# Patient Record
Sex: Female | Born: 1935 | Race: White | Hispanic: No | Smoking: Never smoker
Health system: Southern US, Community
[De-identification: ages and names within clinical notes are randomized; demographics above are authoritative.]

## PROBLEM LIST (undated history)

## (undated) DIAGNOSIS — G459 Transient cerebral ischemic attack, unspecified: Secondary | ICD-10-CM

## (undated) DIAGNOSIS — K219 Gastro-esophageal reflux disease without esophagitis: Secondary | ICD-10-CM

## (undated) DIAGNOSIS — R42 Dizziness and giddiness: Secondary | ICD-10-CM

## (undated) DIAGNOSIS — I10 Essential (primary) hypertension: Secondary | ICD-10-CM

## (undated) DIAGNOSIS — G2581 Restless legs syndrome: Secondary | ICD-10-CM

## (undated) DIAGNOSIS — Z974 Presence of external hearing-aid: Secondary | ICD-10-CM

## (undated) DIAGNOSIS — C44329 Squamous cell carcinoma of skin of other parts of face: Secondary | ICD-10-CM

## (undated) DIAGNOSIS — H3530 Unspecified macular degeneration: Secondary | ICD-10-CM

## (undated) HISTORY — DX: Unspecified macular degeneration: H35.30

## (undated) HISTORY — PX: OTHER SURGICAL HISTORY: SHX169

## (undated) HISTORY — DX: Squamous cell carcinoma of skin of other parts of face: C44.329

## (undated) HISTORY — PX: JOINT REPLACEMENT: SHX530

## (undated) HISTORY — PX: EYE SURGERY: SHX253

## (undated) HISTORY — PX: CHOLECYSTECTOMY: SHX55

## (undated) HISTORY — PX: WISDOM TOOTH EXTRACTION: SHX21

## (undated) HISTORY — DX: Transient cerebral ischemic attack, unspecified: G45.9

---

## 2013-10-30 ENCOUNTER — Inpatient Hospital Stay: Payer: Self-pay | Admitting: Internal Medicine

## 2013-10-30 LAB — CBC
HCT: 37 % (ref 35.0–47.0)
HGB: 12.4 g/dL (ref 12.0–16.0)
MCH: 31.2 pg (ref 26.0–34.0)
MCHC: 33.5 g/dL (ref 32.0–36.0)
MCV: 93 fL (ref 80–100)
PLATELETS: 284 10*3/uL (ref 150–440)
RBC: 3.97 10*6/uL (ref 3.80–5.20)
RDW: 13 % (ref 11.5–14.5)
WBC: 21.2 10*3/uL — AB (ref 3.6–11.0)

## 2013-10-30 LAB — URINALYSIS, COMPLETE
BILIRUBIN, UR: NEGATIVE
Blood: NEGATIVE
Glucose,UR: NEGATIVE mg/dL (ref 0–75)
Ketone: NEGATIVE
Leukocyte Esterase: NEGATIVE
Nitrite: NEGATIVE
Ph: 5 (ref 4.5–8.0)
Protein: 30
RBC,UR: NONE SEEN /HPF (ref 0–5)
Specific Gravity: 1.021 (ref 1.003–1.030)
Squamous Epithelial: 1
WBC UR: 1 /HPF (ref 0–5)

## 2013-10-30 LAB — BASIC METABOLIC PANEL
ANION GAP: 5 — AB (ref 7–16)
BUN: 35 mg/dL — ABNORMAL HIGH (ref 7–18)
CALCIUM: 9.4 mg/dL (ref 8.5–10.1)
Chloride: 97 mmol/L — ABNORMAL LOW (ref 98–107)
Co2: 25 mmol/L (ref 21–32)
Creatinine: 1.58 mg/dL — ABNORMAL HIGH (ref 0.60–1.30)
EGFR (African American): 34 — ABNORMAL LOW
GFR CALC NON AF AMER: 30 — AB
GLUCOSE: 155 mg/dL — AB (ref 65–99)
OSMOLALITY: 266 (ref 275–301)
POTASSIUM: 3.3 mmol/L — AB (ref 3.5–5.1)
SODIUM: 127 mmol/L — AB (ref 136–145)

## 2013-10-30 LAB — TROPONIN I

## 2013-10-30 LAB — PHOSPHORUS: Phosphorus: 2.1 mg/dL — ABNORMAL LOW (ref 2.5–4.9)

## 2013-10-30 LAB — MAGNESIUM: MAGNESIUM: 2 mg/dL

## 2013-10-30 LAB — PROTIME-INR
INR: 1.1
PROTHROMBIN TIME: 14.5 s (ref 11.5–14.7)

## 2013-10-30 LAB — RAPID INFLUENZA A&B ANTIGENS

## 2013-10-31 LAB — CBC WITH DIFFERENTIAL/PLATELET
Basophil #: 0.1 10*3/uL (ref 0.0–0.1)
Basophil %: 0.3 %
Eosinophil #: 0 10*3/uL (ref 0.0–0.7)
Eosinophil %: 0.1 %
HCT: 29.2 % — ABNORMAL LOW (ref 35.0–47.0)
HGB: 10 g/dL — ABNORMAL LOW (ref 12.0–16.0)
Lymphocyte #: 1.6 10*3/uL (ref 1.0–3.6)
Lymphocyte %: 6.7 %
MCH: 31.9 pg (ref 26.0–34.0)
MCHC: 34.3 g/dL (ref 32.0–36.0)
MCV: 93 fL (ref 80–100)
Monocyte #: 2.5 x10 3/mm — ABNORMAL HIGH (ref 0.2–0.9)
Monocyte %: 10.4 %
NEUTROS ABS: 19.5 10*3/uL — AB (ref 1.4–6.5)
Neutrophil %: 82.5 %
Platelet: 238 10*3/uL (ref 150–440)
RBC: 3.14 10*6/uL — ABNORMAL LOW (ref 3.80–5.20)
RDW: 13 % (ref 11.5–14.5)
WBC: 23.6 10*3/uL — ABNORMAL HIGH (ref 3.6–11.0)

## 2013-10-31 LAB — BASIC METABOLIC PANEL
Anion Gap: 10 (ref 7–16)
BUN: 35 mg/dL — ABNORMAL HIGH (ref 7–18)
CALCIUM: 8.5 mg/dL (ref 8.5–10.1)
CHLORIDE: 100 mmol/L (ref 98–107)
CO2: 23 mmol/L (ref 21–32)
Creatinine: 1.47 mg/dL — ABNORMAL HIGH (ref 0.60–1.30)
EGFR (African American): 38 — ABNORMAL LOW
GFR CALC NON AF AMER: 32 — AB
GLUCOSE: 126 mg/dL — AB (ref 65–99)
OSMOLALITY: 276 (ref 275–301)
Potassium: 3.5 mmol/L (ref 3.5–5.1)
Sodium: 133 mmol/L — ABNORMAL LOW (ref 136–145)

## 2013-11-01 LAB — CBC WITH DIFFERENTIAL/PLATELET
Bands: 2 %
HCT: 30 % — ABNORMAL LOW (ref 35.0–47.0)
HGB: 10 g/dL — AB (ref 12.0–16.0)
LYMPHS PCT: 7 %
MCH: 31.1 pg (ref 26.0–34.0)
MCHC: 33.4 g/dL (ref 32.0–36.0)
MCV: 93 fL (ref 80–100)
MONOS PCT: 16 %
Metamyelocyte: 1 %
Myelocyte: 1 %
PLATELETS: 259 10*3/uL (ref 150–440)
RBC: 3.22 10*6/uL — AB (ref 3.80–5.20)
RDW: 12.9 % (ref 11.5–14.5)
SEGMENTED NEUTROPHILS: 73 %
WBC: 28.6 10*3/uL — AB (ref 3.6–11.0)

## 2013-11-01 LAB — BASIC METABOLIC PANEL
Anion Gap: 7 (ref 7–16)
BUN: 31 mg/dL — ABNORMAL HIGH (ref 7–18)
CREATININE: 1.32 mg/dL — AB (ref 0.60–1.30)
Calcium, Total: 8.5 mg/dL (ref 8.5–10.1)
Chloride: 101 mmol/L (ref 98–107)
Co2: 25 mmol/L (ref 21–32)
GFR CALC AF AMER: 43 — AB
GFR CALC NON AF AMER: 37 — AB
GLUCOSE: 119 mg/dL — AB (ref 65–99)
Osmolality: 274 (ref 275–301)
Potassium: 3.8 mmol/L (ref 3.5–5.1)
Sodium: 133 mmol/L — ABNORMAL LOW (ref 136–145)

## 2013-11-01 LAB — WBC: WBC: 28.4 10*3/uL — AB (ref 3.6–11.0)

## 2013-11-01 LAB — URINE CULTURE

## 2013-11-02 LAB — CBC WITH DIFFERENTIAL/PLATELET
BANDS NEUTROPHIL: 3 %
COMMENT - H1-COM4: NORMAL
HCT: 29 % — AB (ref 35.0–47.0)
HGB: 9.7 g/dL — ABNORMAL LOW (ref 12.0–16.0)
LYMPHS PCT: 4 %
MCH: 31.2 pg (ref 26.0–34.0)
MCHC: 33.3 g/dL (ref 32.0–36.0)
MCV: 94 fL (ref 80–100)
MONOS PCT: 6 %
Metamyelocyte: 1 %
Myelocyte: 2 %
NRBC/100 WBC: 1 /
PLATELETS: 282 10*3/uL (ref 150–440)
RBC: 3.1 10*6/uL — AB (ref 3.80–5.20)
RDW: 13.1 % (ref 11.5–14.5)
SEGMENTED NEUTROPHILS: 84 %
WBC: 29.8 10*3/uL — ABNORMAL HIGH (ref 3.6–11.0)

## 2013-11-03 LAB — CBC WITH DIFFERENTIAL/PLATELET
Bands: 3 %
EOS PCT: 1 %
HCT: 31.3 % — ABNORMAL LOW (ref 35.0–47.0)
HGB: 10.4 g/dL — ABNORMAL LOW (ref 12.0–16.0)
Lymphocytes: 12 %
MCH: 31.2 pg (ref 26.0–34.0)
MCHC: 33.2 g/dL (ref 32.0–36.0)
MCV: 94 fL (ref 80–100)
MONOS PCT: 4 %
Metamyelocyte: 2 %
Myelocyte: 1 %
PLATELETS: 309 10*3/uL (ref 150–440)
RBC: 3.34 10*6/uL — ABNORMAL LOW (ref 3.80–5.20)
RDW: 13.1 % (ref 11.5–14.5)
Segmented Neutrophils: 77 %
WBC: 27.2 10*3/uL — ABNORMAL HIGH (ref 3.6–11.0)

## 2013-11-04 LAB — CBC WITH DIFFERENTIAL/PLATELET
BANDS NEUTROPHIL: 7 %
COMMENT - H1-COM2: NORMAL
Eosinophil: 2 %
HCT: 28.9 % — ABNORMAL LOW (ref 35.0–47.0)
HGB: 9.7 g/dL — ABNORMAL LOW (ref 12.0–16.0)
LYMPHS PCT: 8 %
MCH: 31.2 pg (ref 26.0–34.0)
MCHC: 33.4 g/dL (ref 32.0–36.0)
MCV: 93 fL (ref 80–100)
METAMYELOCYTE: 2 %
MYELOCYTE: 2 %
Monocytes: 9 %
PLATELETS: 291 10*3/uL (ref 150–440)
RBC: 3.11 10*6/uL — AB (ref 3.80–5.20)
RDW: 13.2 % (ref 11.5–14.5)
Segmented Neutrophils: 70 %
WBC: 21.6 10*3/uL — ABNORMAL HIGH (ref 3.6–11.0)

## 2013-11-04 LAB — CULTURE, BLOOD (SINGLE)

## 2013-11-05 LAB — EXPECTORATED SPUTUM ASSESSMENT W REFEX TO RESP CULTURE

## 2015-01-20 IMAGING — CR DG CHEST 2V
1 series · 2 of 2 positions shown · non-contrast
Comparison: None

CLINICAL DATA: One week history of fever and productive cough.

EXAM:
CHEST  2 VIEW

[Series 1: pa · 0.17mm/px · 2 of 2 slices shown]
[im 1/2]
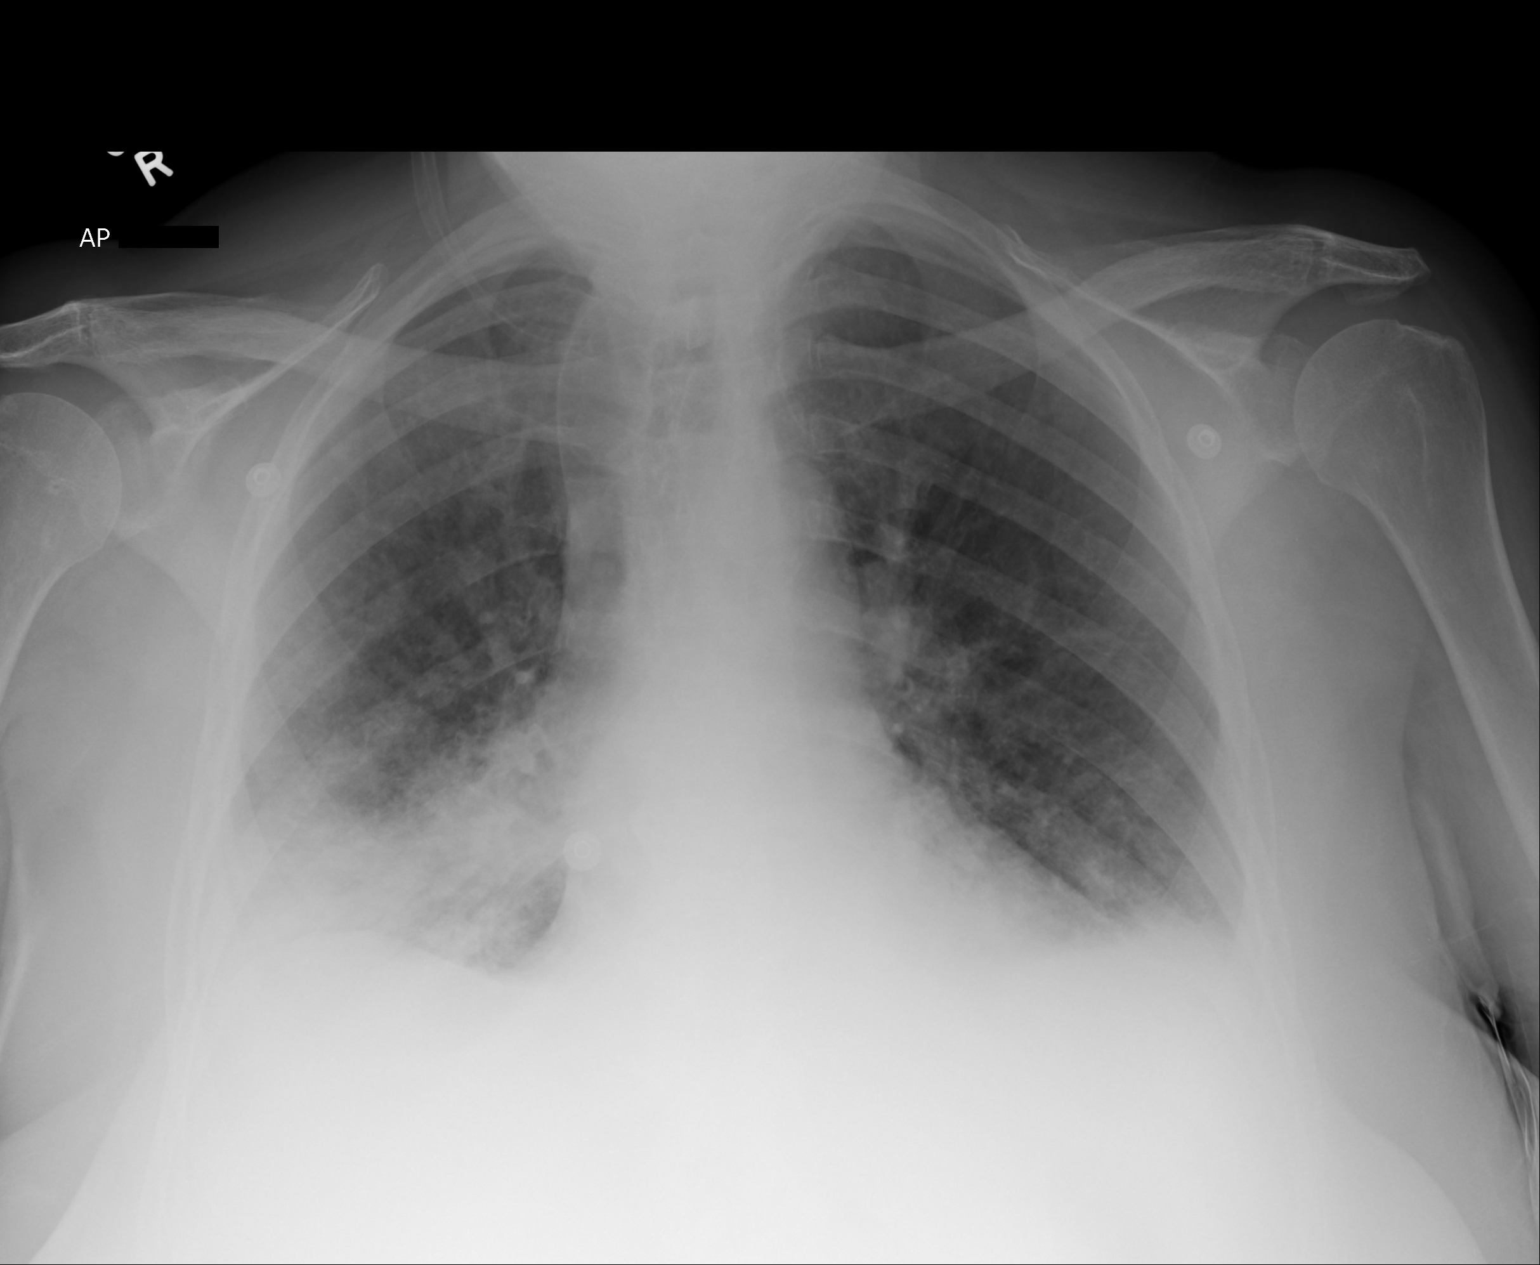
[im 2/2]
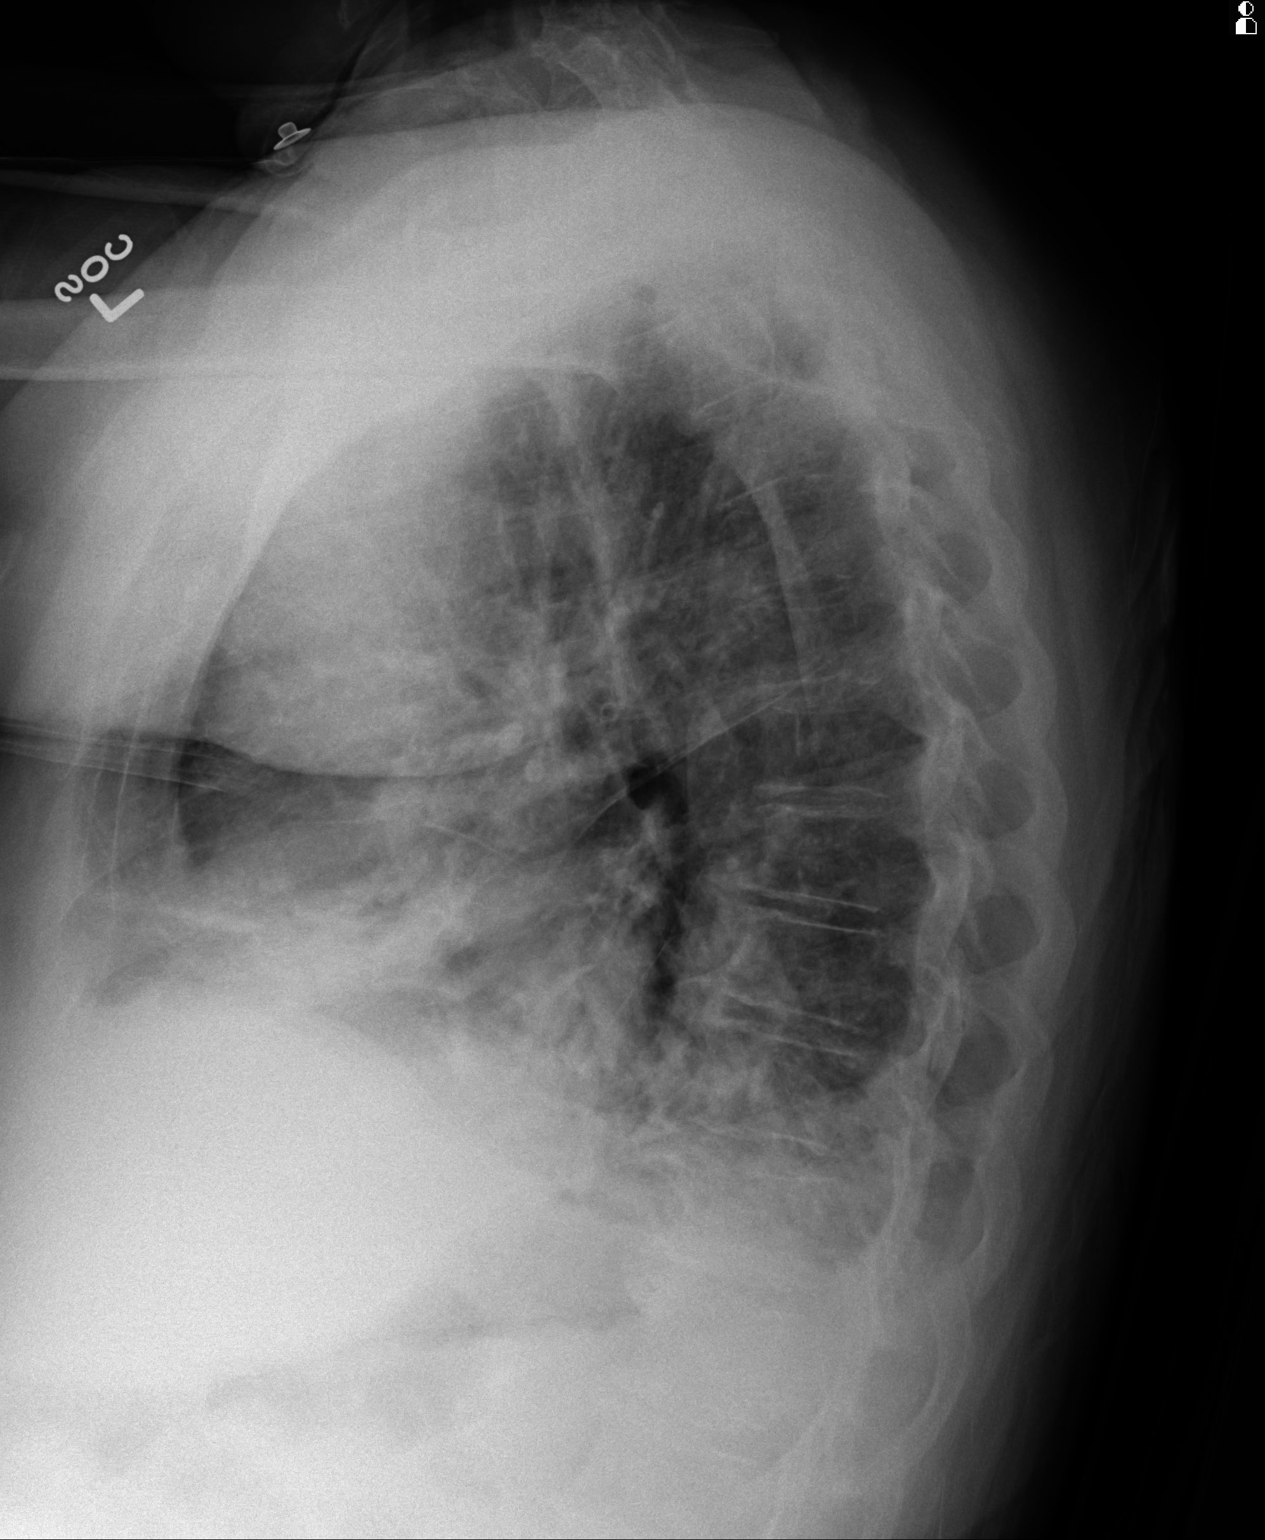

[2 of 2 positions shown; findings below may reference images not displayed]

FINDINGS: The lungs are mildly hypoinflated. There are bibasilar infiltrates
greater on the right than on the left. There is partial obscuration
of the hemidiaphragms in part secondary to small bilateral pleural
effusions. . The cardiac silhouette is normal in size. The pulmonary
vascularity is mildly prominent centrally. There is an azygos lobe
type anatomy.
IMPRESSION: The findings are consistent with bibasilar pneumonia greater on the
right than on the left. There small bilateral pleural effusions.
There is no evidence of pulmonary edema.

## 2015-01-23 IMAGING — CR DG CHEST 2V
1 series · 2 of 2 positions shown · non-contrast
Comparison: PA and lateral chest x-ray dated October 30, 2013.

CLINICAL DATA: Pneumonia

EXAM:
CHEST  2 VIEW

[Series 1: pa · 0.17mm/px · 2 of 2 slices shown]
[im 1/2]
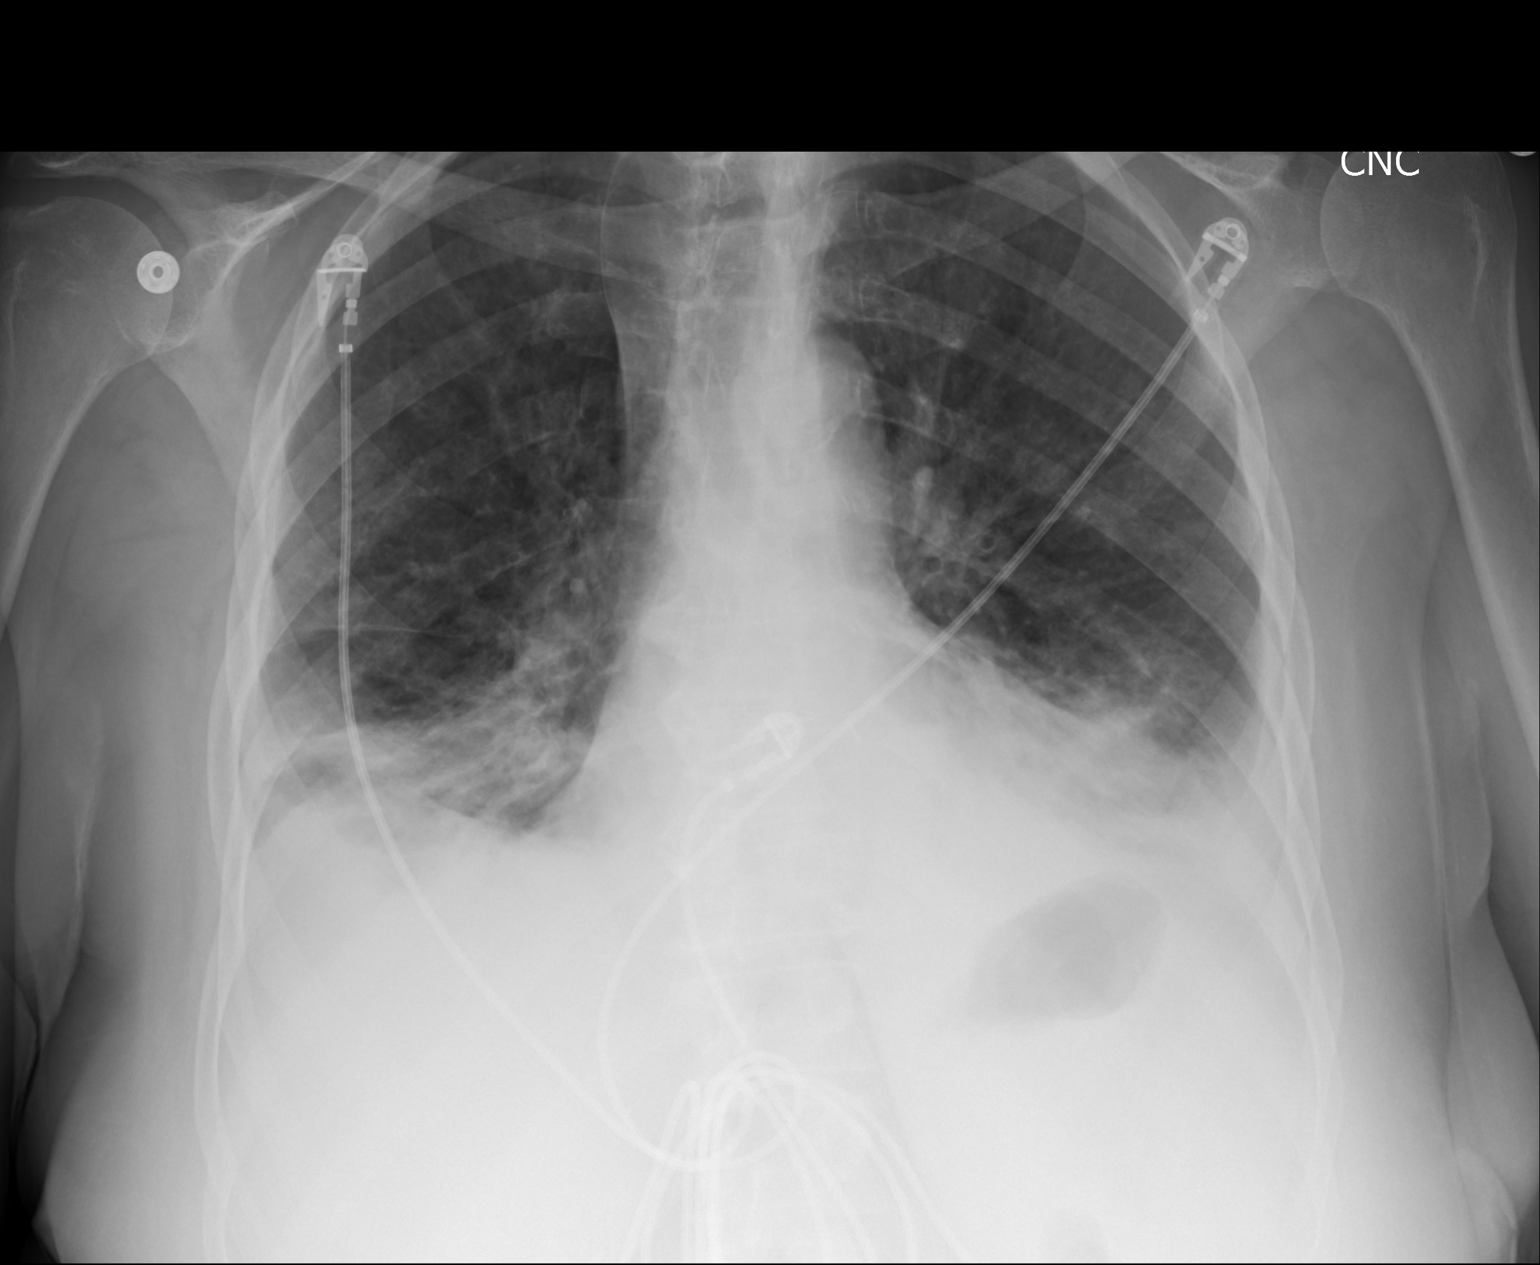
[im 2/2]
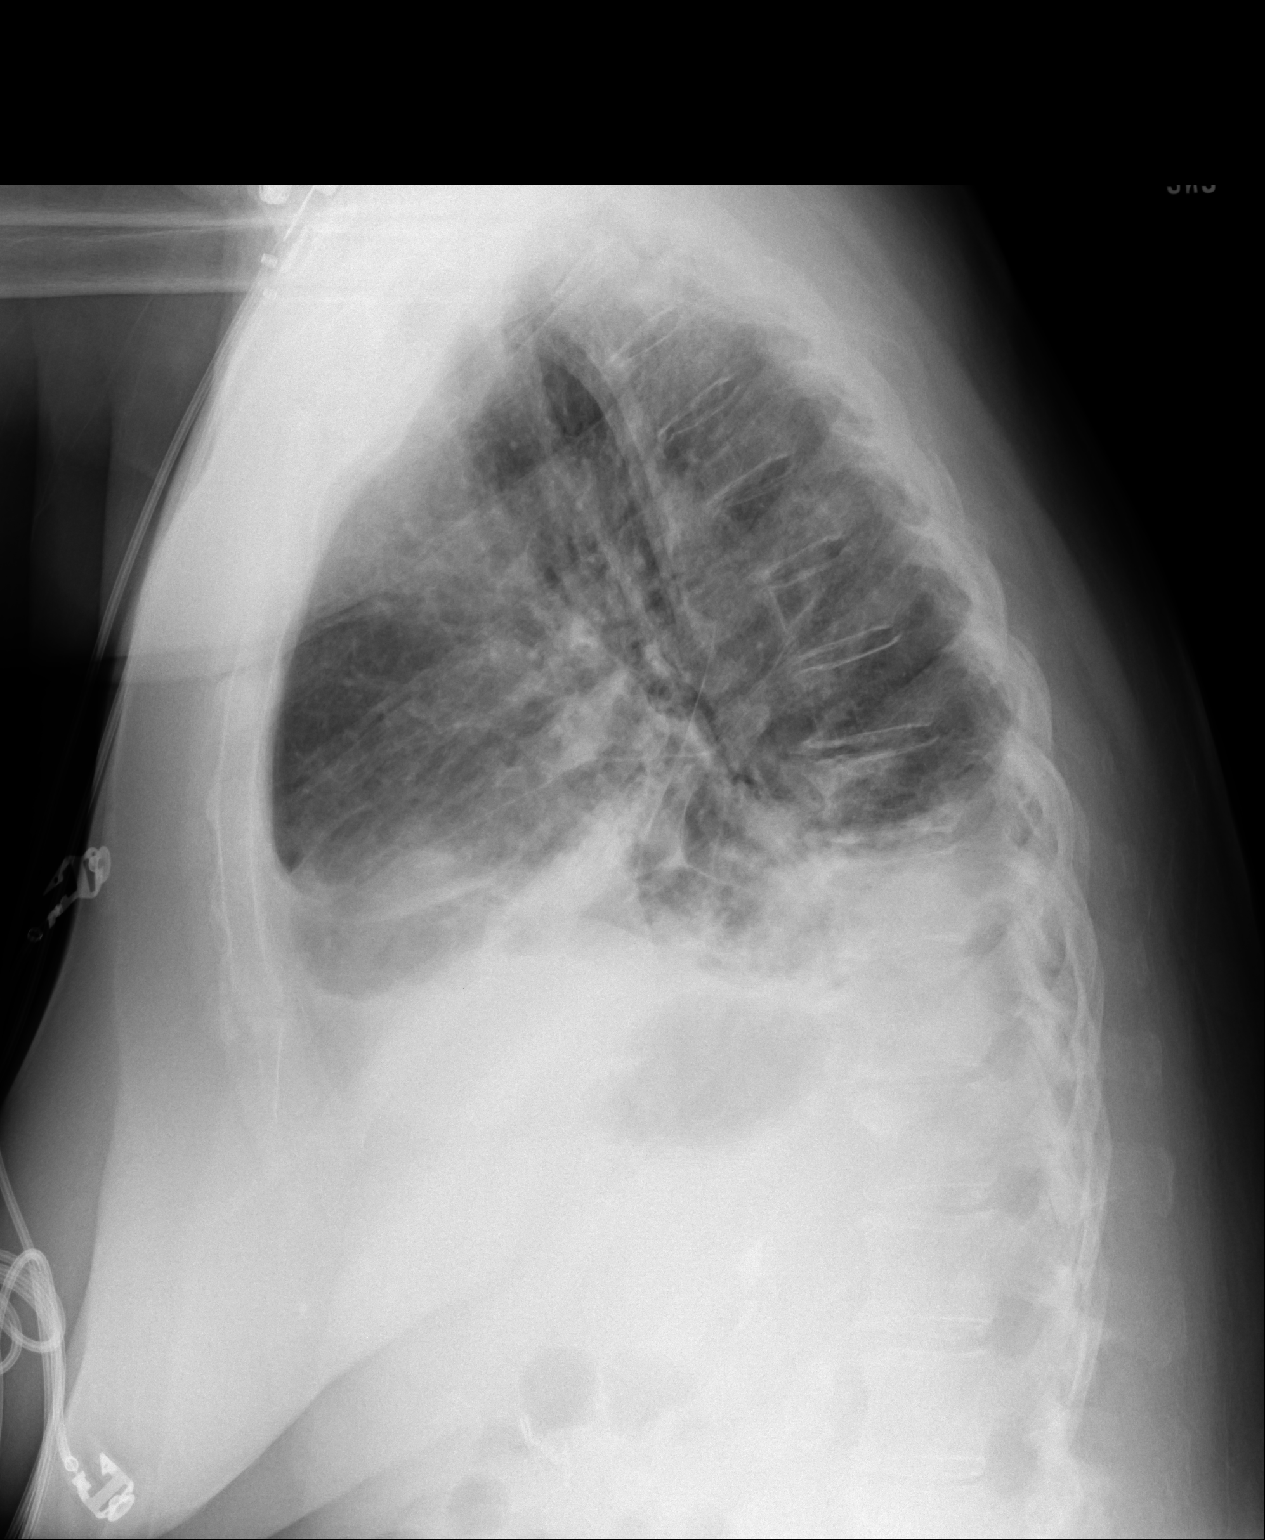

[2 of 2 positions shown; findings below may reference images not displayed]

FINDINGS: The lungs remain mildly hypoinflated. There is an azygos lobe
anatomy on the right. Bibasilar infiltrates are present and better
defined today. The pulmonary interstitial markings are less
prominent. The pulmonary vascularity does not appear engorged. The
cardiac silhouette is not enlarged. There are small bilateral
pleural effusions which have become more conspicuous since the
previous study. The mediastinum is normal in width. The bony thorax
exhibits no acute abnormality.
IMPRESSION: There are persistent bibasilar densities consistent with pneumonia.
There is slightly more pleural fluid visible today. Continued
follow-up films are recommended.

## 2015-02-12 NOTE — H&P (Signed)
PATIENT NAME:  Maureen Mitchell, BARTEL MR#:  [id number] DATE OF BIRTH:  [birth year]  DATE OF ADMISSION:  10/30/2013  PRIMARY CARE PHYSICIAN:  None. The patient just moved from [place], wants to follow up with Dr. Emily Filbert, but has not set up an appointment yet.   CHIEF COMPLAINT:  Shortness of breath, cough, weakness.   HISTORY OF PRESENTING ILLNESS:  An 81 year old Caucasian female patient with a history of hypertension, no COPD, nonsmoker, presents to the Emergency Room complaining of two days of cough, shortness of breath, weakness. The patient here in the Emergency Room has been found to have bilateral basilar pneumonia with sepsis and is being admitted to the hospitalist service for further treatment. The patient had significant tachycardia on presentation into the 130s, was given a dose of Cardizem, presently is in the 90s. Fever of 101.7.   She has had yellow productive sputum, no hemoptysis. No orthopnea, edema, no chest pain. No sick contacts, did get an influenza vaccine this season. Influenza test in the Emergency Room has been negative.   PAST MEDICAL HISTORY:  1.  Hypertension.  2.  Cholecystectomy.  3.  Knee surgery.   SOCIAL HISTORY:  The patient does not smoke. No alcohol. No illicit drugs. Ambulates on her own, lives with her daughter, moved recently here from [place].   ALLERGIES:  CIPRO, SULFA AND TRENTAL.   FAMILY HISTORY:  Hypertension.   REVIEW OF SYSTEMS: CONSTITUTIONAL:  Has fever, fatigue.  EYES:  No blurred vision, pain or redness.  ENT:  No tinnitus, ear pain, has hearing loss.  RESPIRATORY:  Has cough, yellow productive sputum, no COPD.  CARDIOVASCULAR:  No chest pain, orthopnea or edema.  GASTROINTESTINAL:  No nausea, vomiting, diarrhea, abdominal pain.  GENITOURINARY:  No dysuria, hematuria or frequency.  ENDOCRINE:  No polyuria, nocturia or thyroid problems.  HEMATOLOGY AND LYMPHATIC:  No anemia, easy bruising, bleeding.  INTEGUMENTARY:  No acne, rash, lesion.   MUSCULOSKELETAL:  No back pain, arthritis.  NEUROLOGIC:  No focal numbness, weakness, seizure.  PSYCHIATRIC:  No anxiety or depression.   HOME MEDICATIONS:   1.  Hydrochlorothiazide 25 mg day.  2.  Lisinopril 20 mg daily.  3.  Aspirin 81 mg daily.  4.  Omeprazole 40 mg daily.  5.  Vitamin C 1 tablet once a day.  6.  Ambien 10 mg oral once a day at bedtime.  7.  Amitriptyline 10 mg oral once a day at bedtime.   PHYSICAL EXAMINATION: VITAL SIGNS:  Temperature of 101.7, pulse of 137, blood pressure 121/75, saturating 86% on room air, 97% on 3 L oxygen.  GENERAL:  Elderly, Caucasian female patient lying in bed in mild respiratory distress.  PSYCHIATRIC:  Alert and oriented x 3. Mood and affect appropriate. Judgment intact.  HEENT:  Atraumatic, normocephalic. Oral mucosae are moist and pink. External ears and nose normal. No pallor. No icterus. Pupils bilaterally equal and reactive to light.  NECK:  Supple. No thyromegaly or palpable lymph nodes. Trachea midline. No carotid bruit, JVD.  CARDIOVASCULAR:  S1, S2, tachycardic, regular without any murmurs.  RESPIRATORY:  Increased work of breathing. Bilateral coarse breath sounds.  GASTROINTESTINAL:  Soft abdomen, nontender. Bowel sounds present. No hepatosplenomegaly palpable.  SKIN:  Warm and dry. No petechiae, rash, ulcers.  MUSCULOSKELETAL:  No joint swelling, redness, effusion of the large joints. Normal muscle tone.  NEUROLOGICAL:  Motor strength 5/5 in upper and lower extremities. Sensation to fine touch intact all over.  LYMPHATIC:  No cervical lymphadenopathy.  LABORATORY STUDIES:   Glucose of 155, BUN 35, creatinine 1.58, sodium 127, potassium 3.3. GFR of 30. Troponin less than 0.02. WBC 21.8, hemoglobin 12.4, platelets of 284 with INR of 1.1. Influenza A and B negative. Urinalysis shows 1+ bacteria but only 1 WBC. Lactic acid of 1.7.   Chest x-ray shows bilateral basilar pneumonia with small effusions.   ASSESSMENT AND PLAN:  1.   Bilateral basilar pneumonia, community-acquired, with severe sepsis. Will bolus normal saline 1 L at this time for sinus tachycardia and severe sepsis with acute renal failure. Start her on ceftriaxone, azithromycin. Send for blood and sputum cultures. Influenza has been negative. Await culture results.  2.  Acute respiratory failure secondary to above. Put her on oxygen, 3 L and DuoNebs p.r.n.  3.  Acute renal failure secondary to severe sepsis. The patient will be on IV fluids.  4.  Hyponatremia from dehydration.  5.  Hypokalemia. Replace orally.  6.  Hypertension. We will hold lisinopril and hydrochlorothiazide secondary to acute renal failure. Use IV p.r.n. medications.  7.  Deep venous thrombosis prophylaxis with Lovenox.   CODE STATUS:  FULL CODE.   TIME SPENT TODAY ON THIS CASE WAS:  50 minutes.   ____________________________ Leia Alf Zev Blue, MD srs:jm D: 10/30/2013 14:51:51 ET T: 10/30/2013 15:18:46 ET JOB#: [id number]  cc: Alveta Heimlich R. Darvin Neighbours, MD, <Dictator> Rusty Aus, MD Neita Carp MD ELECTRONICALLY SIGNED 11/10/2013 18:15

## 2015-02-12 NOTE — Discharge Summary (Signed)
PATIENT NAME:  Maureen Mitchell, Maureen Mitchell MR#:  [id number] DATE OF BIRTH:  12/30/1926  DATE OF ADMISSION:  10/30/2013 DATE OF DISCHARGE:  11/04/2013  ADMISSION DIAGNOSES:  1.  Severe sepsis. 2.  Community-acquired pneumonia.  DISCHARGE DIAGNOSES:  1.  Severe sepsis. 2.  Community-acquired pneumonia. 3.  Acute respiratory failure from community-acquired pneumonia.   LABORATORIES AT DISCHARGE: White blood cells 21, hemoglobin 9.7, hematocrit 29, platelets are 291. Blood cultures negative to date.   HOSPITAL COURSE: A very pleasant 81 year old female, who presented with acute respiratory failure with severe sepsis from community-acquired pneumonia. For further details, please refer to the H and P.  1.  Severe sepsis from community-acquired pneumonia. The patient was initially started on Rocephin and azithromycin to treat for community-acquired pneumonia. Her white count was 21 when she presented to the ER. During her hospitalization, her white blood cell count was elevated up to 29.8 at its maximum. Her chest x-ray just shows bilateral pneumonia. She had no signs of aspiration. I did have speech see the patient in consultation. I suspect this could be secondary to stress reaction as she was afebrile throughout hospitalization. However, I did broaden antibiotics to Zosyn and azithromycin. Respiratory-wise, she is afebrile and doing well. She is not requiring any oxygen. On discharge, her lungs have very few basilar crackles. She has good airflow and no wheezing. 2.  Community-acquired pneumonia. The patient will be discharged on Augmentin and Levaquin due to the elevated white blood cell count. Her blood cultures are negative o date. I doubt this is MRSA as her white blood cell count has now improved dramatically as well as she was afebrile without treatment on vancomycin.  3.  Acute respiratory failure from community-acquired pneumonia, improved. The patient is not on oxygen at discharge.  4.  Acute renal failure  from sepsis and dehydration, improved with IV fluids and treatment for her sepsis. 5.  Hypertension. The patient will continue her outpatient medications.  6.  Leukocytosis, unclear etiology as the patient was afebrile and no signs of aspiration. Her white blood cell count is trending down.   DISCHARGE MEDICATIONS:  1.  Augmentin 875 mg b.i.d. for 5 days.  2.  Zithromax 500 mg daily.  3.  Omeprazole 40 mg daily.  4.  Aspirin 81 mg daily.  5.  Lisinopril 20 mg daily.  6.  Hydrochlorothiazide 25 mg daily.  7.  Amitriptyline 10 mg at bedtime.  8.  Ambien 10 mg at bedtime. 9.  Vitamin C 1 tablet daily.  10. Codeine/promethazine 5 mL q.6 hours p.r.n. cough.   DISPOSITION: Discharge with home health and physical therapy and nurse.   DISCHARGE DIET: Low sodium, regular consistency. Thin liquids. General aspiration precautions.    DISCHARGE ACTIVITY: As tolerated.    DISCHARGE REFERRAL: Home health.  DISCHARGE FOLLOWUP: The patient will follow up with her MD in 1 to 2 weeks. The patient is medically stable for discharge.   TIME SPENT: Approximately 35 minutes.  ____________________________ Donell Beers. Benjie Karvonen, MD spm:aw D: 11/04/2013 13:12:25 ET T: 11/04/2013 13:44:29 ET JOB#: [id number]  cc: Zakariya Knickerbocker P. Benjie Karvonen, MD, <Dictator> Donell Beers Ayan Yankey MD ELECTRONICALLY SIGNED 11/04/2013 14:27

## 2015-08-04 ENCOUNTER — Emergency Department
Admission: EM | Admit: 2015-08-04 | Discharge: 2015-08-04 | Disposition: A | Discharge status: Home / Self Care | Payer: Medicare Other | Attending: Emergency Medicine | Admitting: Emergency Medicine

## 2015-08-04 ENCOUNTER — Emergency Department: Payer: Medicare Other

## 2015-08-04 ENCOUNTER — Encounter: Payer: Self-pay | Admitting: Emergency Medicine

## 2015-08-04 DIAGNOSIS — W000XXA Fall on same level due to ice and snow, initial encounter: Secondary | ICD-10-CM | POA: Insufficient documentation

## 2015-08-04 DIAGNOSIS — Y998 Other external cause status: Secondary | ICD-10-CM | POA: Insufficient documentation

## 2015-08-04 DIAGNOSIS — Y9389 Activity, other specified: Secondary | ICD-10-CM | POA: Insufficient documentation

## 2015-08-04 DIAGNOSIS — S76811A Strain of other specified muscles, fascia and tendons at thigh level, right thigh, initial encounter: Secondary | ICD-10-CM | POA: Diagnosis not present

## 2015-08-04 DIAGNOSIS — Y92000 Kitchen of unspecified non-institutional (private) residence as  the place of occurrence of the external cause: Secondary | ICD-10-CM | POA: Insufficient documentation

## 2015-08-04 DIAGNOSIS — I10 Essential (primary) hypertension: Secondary | ICD-10-CM | POA: Diagnosis not present

## 2015-08-04 DIAGNOSIS — S8991XA Unspecified injury of right lower leg, initial encounter: Secondary | ICD-10-CM | POA: Diagnosis present

## 2015-08-04 DIAGNOSIS — S76311A Strain of muscle, fascia and tendon of the posterior muscle group at thigh level, right thigh, initial encounter: Secondary | ICD-10-CM

## 2015-08-04 HISTORY — DX: Essential (primary) hypertension: I10

## 2015-08-04 MED ORDER — ONDANSETRON 4 MG PO TBDP
ORAL_TABLET | ORAL | Status: AC
  Filled 2015-08-04: qty 1

## 2015-08-04 MED ORDER — GI COCKTAIL ~~LOC~~: 30.0000 mL | Freq: Once | ORAL | Status: DC

## 2015-08-04 MED ORDER — ONDANSETRON 4 MG PO TBDP
4.0000 mg | ORAL_TABLET | Freq: Once | ORAL | Status: AC
  Administered 2015-08-04: 4 mg via ORAL

## 2015-08-04 MED ORDER — OXYCODONE-ACETAMINOPHEN 5-325 MG PO TABS: 1.0000 | ORAL_TABLET | Freq: Four times a day (QID) | ORAL | Status: AC | PRN

## 2015-08-04 NOTE — ED Notes (Signed)
EMS states that pt was in kitchen and slipped on ice and fell backwards. Pt denies hitting head or LOC. Pt is complaining of pain to right thigh. Pt was given 150 mcg of Fentanyl PTA by EMS

## 2015-08-04 NOTE — ED Provider Notes (Signed)
[facility] Emergency Department Provider Note  ____________________________________________  Time seen: Approximately 7:59 PM  I have reviewed the triage vital signs and the nursing notes.   HISTORY  Chief Complaint Fall    HPI Maureen Mitchell is a 81 y.o. female presenting for posterior right leg pain after a mechanical fall. Patient states that she slipped on an ice cube in the kitchen and fell backwards. She did not have her head or lose consciousness. She denies any chest pain, shortness of breath, headache, vomiting, or neck or back pain. She is having pain on the posterior aspect of the right thigh. No numbness, tingling, or weakness. It feels better if she elevates it on a pillow.   Past Medical History  Diagnosis Date  . Hypertension     There are no active problems to display for this patient.   Past Surgical History  Procedure Laterality Date  . Joint replacement    . Cholecystectomy    . Eye surgery      Current Outpatient Rx  Name  Route  Sig  Dispense  Refill  . oxyCODONE-acetaminophen (ROXICET) 5-325 MG tablet   Oral   Take 1 tablet by mouth every 6 (six) hours as needed.   12 tablet   0     Allergies Ciprofloxacin; Sulfa antibiotics; and Trental  History reviewed. No pertinent family history.  Social History Social History  Substance Use Topics  . Smoking status: Never Smoker   . Smokeless tobacco: None  . Alcohol Use: No    Review of Systems Constitutional: No fever/chills Eyes: No visual changes. ENT: No sore throat. Cardiovascular: Denies chest pain, palpitations. Respiratory: Denies shortness of breath.  No cough. Gastrointestinal: No abdominal pain.  No nausea, no vomiting.  No diarrhea.  No constipation. Genitourinary: Negative for dysuria. Musculoskeletal: Negative for back pain. Positive for right leg pain Skin: Negative for rash. Neurological: Negative for headaches, focal weakness or numbness.  10-point  ROS otherwise negative.  ____________________________________________   PHYSICAL EXAM:  VITAL SIGNS: ED Triage Vitals  Enc Vitals Group     BP 08/04/15 1928 145/68 mmHg     Pulse Rate 08/04/15 1928 79     Resp 08/04/15 1928 16     Temp 08/04/15 1928 98.2 F (36.8 C)     Temp Source 08/04/15 1928 Oral     SpO2 08/04/15 1918 97 %     Weight 08/04/15 1923 225 lb (102.059 kg)     Height 08/04/15 1923 5\' 8"  (1.727 m)     Head Cir --      Peak Flow --      Pain Score 08/04/15 1924 6     Pain Loc --      Pain Edu? --      Excl. in [place]? --     Constitutional: Alert and oriented. Well appearing and in no acute distress. Answers questions appropriately. Eyes: Conjunctivae are normal.  EOMI. Head: Atraumatic. Nose: No congestion/rhinnorhea. Mouth/Throat: Mucous membranes are moist.  Neck: No stridor.  Supple.  Full range of motion without pain Cardiovascular: Normal rate, regular rhythm. No murmurs, rubs or gallops.  Respiratory: Normal respiratory effort.  No retractions. Lungs CTAB.  No wheezes, rales or ronchi. Gastrointestinal: Soft and nontender. No distention. No peritoneal signs. Musculoskeletal: No LE edema. Normal right DP and PT pulses. Full range of motion of the right ankle, knee, hip without pain. Patient has reproducible pain with flexion of the hip and knee simultaneously. She also has  tenderness to palpation in the middle of the hamstring. 5 out of 5 flexor, plantar flexion and dorsiflexion, hamstring and quad strength. Normal sensation to light touch throughout the lower extremity. Skin exam is normal without erythema, abrasion, or ecchymosis. Neurologic:  Normal speech and language. No gross focal neurologic deficits are appreciated.  Skin:  Skin is warm, dry and intact. No rash noted. Psychiatric: Mood and affect are normal. Speech and behavior are normal.  Normal judgement.  ____________________________________________   LABS (all labs ordered are listed, but only  abnormal results are displayed)  Labs Reviewed - No data to display ____________________________________________  EKG  Not indicated ____________________________________________  RADIOLOGY  Dg Pelvis 1-2 Views  08/04/2015  CLINICAL DATA:  81 year old female with acute pelvic pain following fall today. Initial encounter. EXAM: PELVIS - 1-2 VIEW COMPARISON:  None. FINDINGS: There is no evidence of pelvic fracture or diastasis. No pelvic bone lesions are seen. IMPRESSION: Negative. Electronically Signed   By: Margarette Canada M.D.   On: 08/04/2015 20:38   Dg Femur, Min 2 Views Right  08/04/2015  CLINICAL DATA:  Fall on ice today backwards.  Right thigh pain. EXAM: RIGHT FEMUR 2 VIEWS COMPARISON:  None. FINDINGS: Total knee prosthesis. Minimal spurring of the right femoral head. Preserved articular space in the right hip. Bony demineralization. Faint atherosclerotic vascular calcification. No acute bony findings. IMPRESSION: 1. No acute bony findings. 2. Total knee prosthesis. 3. Minimal spurring of the right femoral head. Electronically Signed   By: Van Clines M.D.   On: 08/04/2015 20:38    ____________________________________________   PROCEDURES  Procedure(s) performed: None  Critical Care performed: No ____________________________________________   INITIAL IMPRESSION / ASSESSMENT AND PLAN / ED COURSE  Pertinent labs & imaging results that were available during my care of the patient were reviewed by me and considered in my medical decision making (see chart for details).  81 y.o. female status post mechanical fall presenting with ongoing right hamstring pain. It is unlikely that she has fractured anything, however will get x-ray to rule this out. Most likely etiology of her pain is hamstring strain.  ____________________________________________  FINAL CLINICAL IMPRESSION(S) / ED DIAGNOSES  Final diagnoses:  Hamstring muscle strain, right, initial encounter      NEW  MEDICATIONS STARTED DURING THIS VISIT:  New Prescriptions   OXYCODONE-ACETAMINOPHEN (ROXICET) 5-325 MG TABLET    Take 1 tablet by mouth every 6 (six) hours as needed.     Eula Listen, MD 08/04/15 2055

## 2015-08-04 NOTE — Discharge Instructions (Signed)
Please make an appointment follow-up with Dr. Sabra Heck. In the meantime, apply ice to your right hamstring for 20 minutes every 2 hours. Please keep your leg elevated as much as possible. You may take Tylenol or Motrin for mild to moderate pain and Percocet for severe pain. Do not drive within 8 hours of taking Percocet.   Return to the emergency department for severe pain and inability to walk, numbness tingling or weakness, or any other symptoms concerning to you.   Cryotherapy Cryotherapy means treatment with cold. Ice or gel packs can be used to reduce both pain and swelling. Ice is the most helpful within the first 24 to 48 hours after an injury or flare-up from overusing a muscle or joint. Sprains, strains, spasms, burning pain, shooting pain, and aches can all be eased with ice. Ice can also be used when recovering from surgery. Ice is effective, has very few side effects, and is safe for most people to use. PRECAUTIONS  Ice is not a safe treatment option for people with:  Raynaud phenomenon. This is a condition affecting small blood vessels in the extremities. Exposure to cold may cause your problems to return.  Cold hypersensitivity. There are many forms of cold hypersensitivity, including:  Cold urticaria. Red, itchy hives appear on the skin when the tissues begin to warm after being iced.  Cold erythema. This is a red, itchy rash caused by exposure to cold.  Cold hemoglobinuria. Red blood cells break down when the tissues begin to warm after being iced. The hemoglobin that carry oxygen are passed into the urine because they cannot combine with blood proteins fast enough.  Numbness or altered sensitivity in the area being iced. If you have any of the following conditions, do not use ice until you have discussed cryotherapy with your caregiver:  Heart conditions, such as arrhythmia, angina, or chronic heart disease.  High blood pressure.  Healing wounds or open skin in the area  being iced.  Current infections.  Rheumatoid arthritis.  Poor circulation.  Diabetes. Ice slows the blood flow in the region it is applied. This is beneficial when trying to stop inflamed tissues from spreading irritating chemicals to surrounding tissues. However, if you expose your skin to cold temperatures for too long or without the proper protection, you can damage your skin or nerves. Watch for signs of skin damage due to cold. HOME CARE INSTRUCTIONS Follow these tips to use ice and cold packs safely.  Place a dry or damp towel between the ice and skin. A damp towel will cool the skin more quickly, so you may need to shorten the time that the ice is used.  For a more rapid response, add gentle compression to the ice.  Ice for no more than 10 to 20 minutes at a time. The bonier the area you are icing, the less time it will take to get the benefits of ice.  Check your skin after 5 minutes to make sure there are no signs of a poor response to cold or skin damage.  Rest 20 minutes or more between uses.  Once your skin is numb, you can end your treatment. You can test numbness by very lightly touching your skin. The touch should be so light that you do not see the skin dimple from the pressure of your fingertip. When using ice, most people will feel these normal sensations in this order: cold, burning, aching, and numbness.  Do not use ice on someone who cannot communicate  their responses to pain, such as small children or people with dementia. HOW TO MAKE AN ICE PACK Ice packs are the most common way to use ice therapy. Other methods include ice massage, ice baths, and cryosprays. Muscle creams that cause a cold, tingly feeling do not offer the same benefits that ice offers and should not be used as a substitute unless recommended by your caregiver. To make an ice pack, do one of the following:  Place crushed ice or a bag of frozen vegetables in a sealable plastic bag. Squeeze out the  excess air. Place this bag inside another plastic bag. Slide the bag into a pillowcase or place a damp towel between your skin and the bag.  Mix 3 parts water with 1 part rubbing alcohol. Freeze the mixture in a sealable plastic bag. When you remove the mixture from the freezer, it will be slushy. Squeeze out the excess air. Place this bag inside another plastic bag. Slide the bag into a pillowcase or place a damp towel between your skin and the bag. SEEK MEDICAL CARE IF:  You develop white spots on your skin. This may give the skin a blotchy (mottled) appearance.  Your skin turns blue or pale.  Your skin becomes waxy or hard.  Your swelling gets worse. MAKE SURE YOU:   Understand these instructions.  Will watch your condition.  Will get help right away if you are not doing well or get worse.   This information is not intended to replace advice given to you by your health care provider. Make sure you discuss any questions you have with your health care provider.   Document Released: 06/04/2011 Document Revised: 10/29/2014 Document Reviewed: 06/04/2011 Elsevier Interactive Patient Education 2016 [place].  Hamstring Strain A hamstring strain is an injury that occurs when the hamstring muscles are overstretched or overloaded. The hamstring muscles are a group of muscles at the back of the thighs. These muscles are used in straightening the hips, bending the knees, and pulling back the legs. This type of injury is often called a pulled hamstring muscle. The severity of a muscle strain is rated in degrees. First-degree strains have the least amount of muscle fiber tearing and pain. Second-degree and third-degree strains have increasingly more tearing and pain. CAUSES Hamstring strains occur when a sudden, violent force is placed on these muscles and stretches them too far. This often occurs during activities that involve running, jumping, kicking, or weight lifting. RISK  FACTORS Hamstring strains are especially common in athletes. Other things that can increase your risk for this injury include:  Having low strength, endurance, or flexibility of the hamstring muscles.  Performing high-impact physical activity.  Having poor physical fitness.  Having a previous leg injury.  Having fatigued muscles.  Older age. SIGNS AND SYMPTOMS  Pain in the back of the thigh.  Bruising.  Swelling.  Muscle spasm.  Difficulty using the muscle because of pain or lack of normal function. For severe strains, you may have a popping or snapping feeling when the injury occurs. DIAGNOSIS Your health care provider will perform a physical exam and ask about your medical history.  TREATMENT Often, the best treatment for a hamstring strain is protecting, resting, icing, applying compression, and elevating the injured area. This is referred to as the PRICE method of treatment. Your health care provider may also recommend medicines to help reduce pain or inflammation. HOME CARE INSTRUCTIONS  Use the PRICE method of treatment to promote muscle healing during  the first 2-3 days after your injury. The PRICE method involves:  P--Protecting the muscle from being injured again.  R--Restricting your activity and resting the injured body part.  I--Icing your injury. To do this, put ice in a plastic bag. Place a towel between your skin and the bag. Then, apply the ice and leave it on for 20 minutes, 2-3 times per day. After the third day, switch to moist heat packs.  C--Applying compression to the injured area with an elastic bandage. Be careful not to wrap it too tightly. That may interfere with blood circulation or may increase swelling.  E--Elevating the injured body part above the level of your heart as often as you can. You can do this by putting a pillow under your thigh when you sit or lie down.  Take medicines only as directed by your health care provider.  Begin  exercising or stretching as directed by your health care provider.  Do not return to full activity level until your health care provider approves.  Keep all follow-up visits as directed by your health care provider. This is important. SEEK MEDICAL CARE IF:  You have increasing pain or swelling in the injured area.  You have numbness, tingling, or a significant loss of strength in the injured area.  Your foot or your toes become cold or turn blue.   This information is not intended to replace advice given to you by your health care provider. Make sure you discuss any questions you have with your health care provider.   Document Released: 07/03/2001 Document Revised: 10/29/2014 Document Reviewed: 05/24/2014 Elsevier Interactive Patient Education [employer].

## 2016-10-24 IMAGING — CR DG PELVIS 1-2V
1 series · 1 of 1 positions shown · non-contrast
Comparison: None.

CLINICAL DATA: 87-year-old female with acute pelvic pain following
fall today. Initial encounter.

EXAM:
PELVIS - 1-2 VIEW

[t pelvis ap]
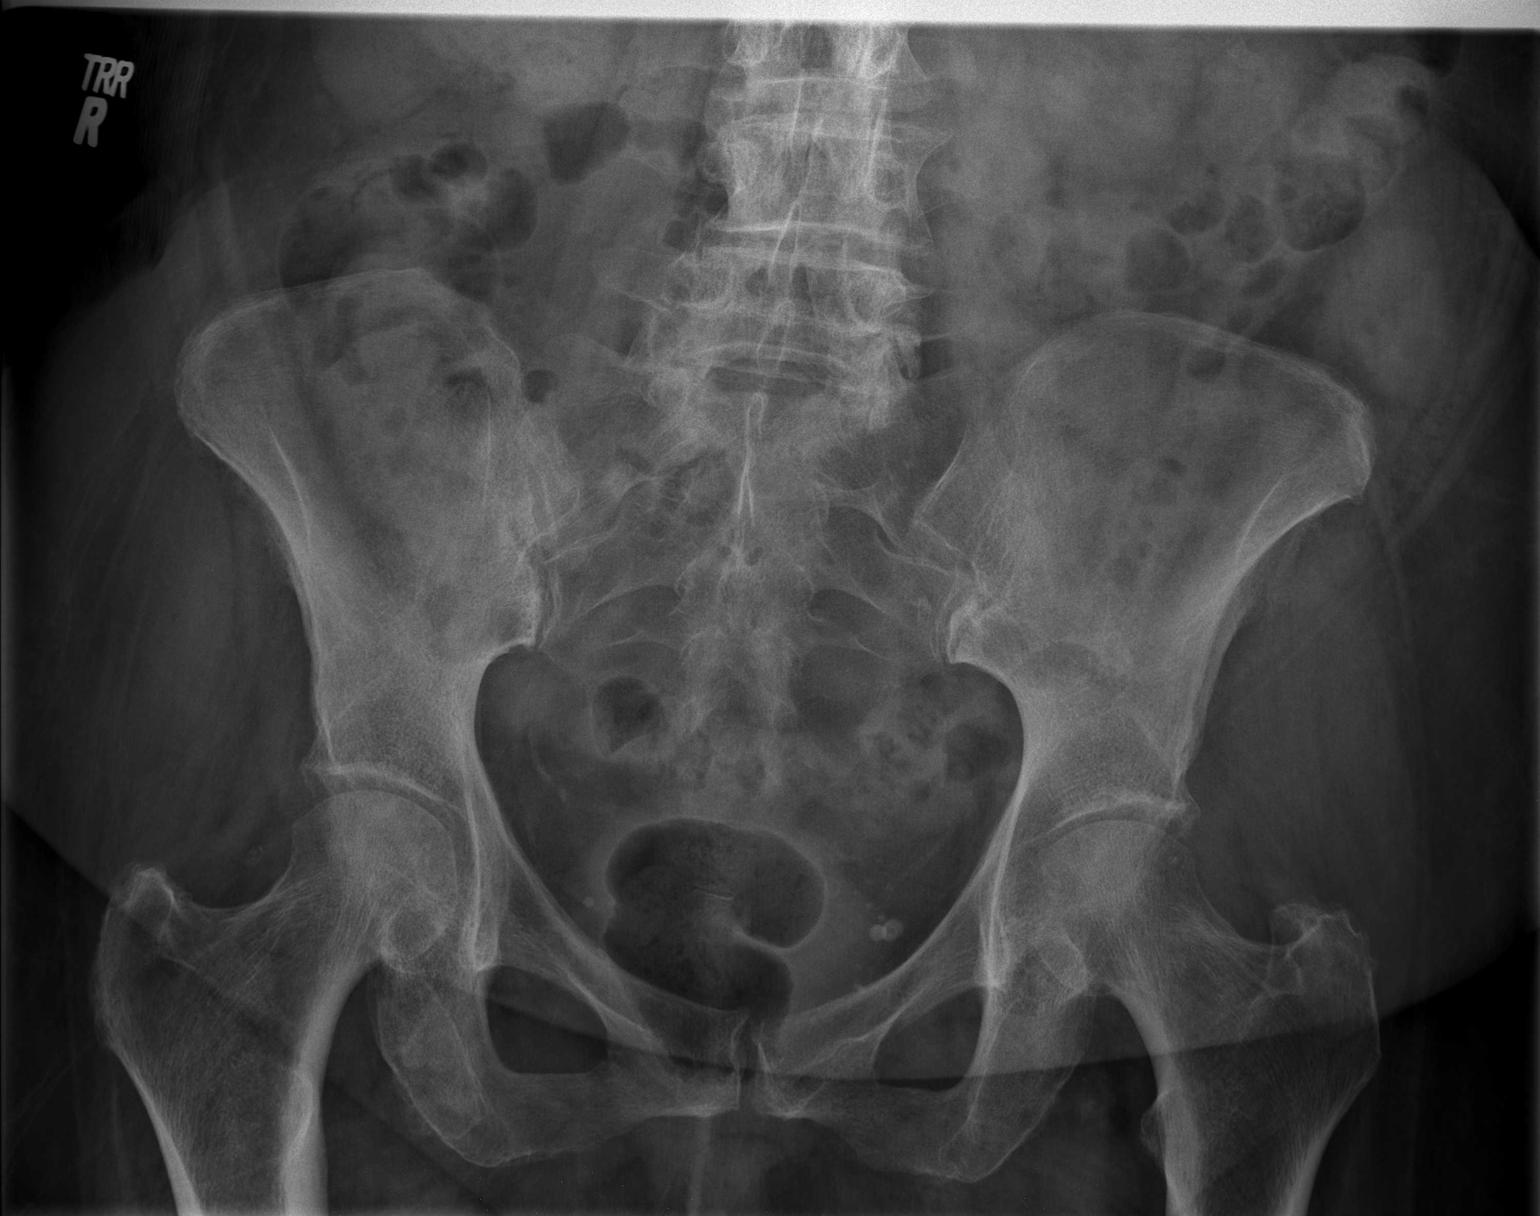

[1 of 1 positions shown; findings below may reference images not displayed]

FINDINGS: There is no evidence of pelvic fracture or diastasis. No pelvic bone
lesions are seen.
IMPRESSION: Negative.

## 2017-07-02 ENCOUNTER — Encounter: Payer: Self-pay | Admitting: *Deleted

## 2017-07-03 NOTE — Discharge Instructions (Signed)
INSTRUCTIONS FOLLOWING OCULOPLASTIC SURGERY °AMY M. FOWLER, MD ° °AFTER YOUR EYE SURGERY, THER ARE MANY THINGS THWIHC YOU, THE PATIENT, CAN DO TO ASSURE THE BEST POSSIBLE RESULT FROM YOUR OPERATION.  THIS SHEET SHOULD BE REFERRED TO WHENEVER QUESTIONS ARISE.  IF THERE ARE ANY QUESTIONS NOT ANSWERED HERE, DO NOT HESITATE TO CALL OUR OFFICE AT [phone] OR [phone].  THERE IS ALWAYS [facility] AVAILABLE TO CALL IF QUESTIONS OR PROBLEMS ARISE. ° °VISION: Your vision may be blurred and out of focus after surgery until you are able to stop using your ointment, swelling resolves and your eye(s) heal. This may take 1 to 2 weeks at the least.  If your vision becomes gradually more dim or dark, this is not normal and you need to call our office immediately. ° °EYE CARE: For the first 48 hours after surgery, use ice packs frequently - “20 minutes on, 20 minutes off” - to help reduce swelling and bruising.  Small bags of frozen peas or corn make good ice packs along with cloths soaked in ice water.  If you are wearing a patch or other type of dressing following surgery, keep this on for the amount of time specified by your doctor.  For the first week following surgery, you will need to treat your stitches with great care.  If is OK to shower, but take care to not allow soapy water to run into your eye(s) to help reduce changes of infection.  You may gently clean the eyelashes and around the eye(s) with cotton balls and sterile water, BUT DO NOT RUB THE STITCHES VIGOROUSLY.  Keeping your stitches moist with ointment will help promote healing with minimal scar formation. ° °ACTIVITY: When you leave the surgery center, you should go home, rest and be inactive.  The eye(s) may feel scratchy and keeping the eyes closed will allow for faster healing.  The first week following surgery, avoid straining (anything making the face turn red) or lifting over 20 pounds.  Additionally, avoid bending which causes your head to go below  your waist.  Using your eyes will NOT harm them, so feel free to read, watch television, use the computer, etc as desired.  Driving depends on each individual, so check with your doctor if you have questions about driving. ° °MEDICATIONS:  You will be given a prescription for an ointment to use 4 times a day on your stitches.  You can use the ointment in your eyes if they feel scratchy or irritated.  If you eyelid(s) don’t close completely when you sleep, put some ointment in your eyes before bedtime. ° °EMERGENCY: If you experience SEVERE EYE PAIN OR HEADACHE UNRELIEVED BY TYLENOL OR PERCOCET, NAUSEA OR VOMITING, WORSENING REDNESS, OR WORSENING VISION (ESPECIALLY VISION THAT WA INITIALLY BETTER) CALL [phone] OR [phone] DURING BUSINESS HOURS OR AFTER HOURS. ° °General Anesthesia, Adult, Care After °These instructions provide you with information about caring for yourself after your procedure. Your health care provider may also give you more specific instructions. Your treatment has been planned according to current medical practices, but problems sometimes occur. Call your health care provider if you have any problems or questions after your procedure. °What can I expect after the procedure? °After the procedure, it is common to have: °· Vomiting. °· A sore throat. °· Mental slowness. ° °It is common to feel: °· Nauseous. °· Cold or shivery. °· Sleepy. °· Tired. °· Sore or achy, even in parts of your body where you did not have surgery. ° °  Follow these instructions at home: °For at least 24 hours after the procedure: °· Do not: °? Participate in activities where you could fall or become injured. °? Drive. °? Use heavy machinery. °? Drink alcohol. °? Take sleeping pills or medicines that cause drowsiness. °? Make important decisions or sign legal documents. °? Take care of children on your own. °· Rest. °Eating and drinking °· If you vomit, drink water, juice, or soup when you can drink without  vomiting. °· Drink enough fluid to keep your urine clear or pale yellow. °· Make sure you have little or no nausea before eating solid foods. °· Follow the diet recommended by your health care provider. °General instructions °· Have a responsible adult stay with you until you are awake and alert. °· Return to your normal activities as told by your health care provider. Ask your health care provider what activities are safe for you. °· Take over-the-counter and prescription medicines only as told by your health care provider. °· If you smoke, do not smoke without supervision. °· Keep all follow-up visits as told by your health care provider. This is important. °Contact a health care provider if: °· You continue to have nausea or vomiting at home, and medicines are not helpful. °· You cannot drink fluids or start eating again. °· You cannot urinate after 8-12 hours. °· You develop a skin rash. °· You have fever. °· You have increasing redness at the site of your procedure. °Get help right away if: °· You have difficulty breathing. °· You have chest pain. °· You have unexpected bleeding. °· You feel that you are having a life-threatening or urgent problem. °This information is not intended to replace advice given to you by your health care provider. Make sure you discuss any questions you have with your health care provider. °Document Released: 01/14/2001 Document Revised: 03/12/2016 Document Reviewed: 09/22/2015 °Elsevier Interactive Patient Education © 2018 Elsevier Inc[employer] ° °

## 2017-07-09 ENCOUNTER — Encounter
Admission: RE | Disposition: A | Discharge status: Home / Self Care | Payer: Self-pay | Source: Ambulatory Visit | Attending: Ophthalmology

## 2017-07-09 ENCOUNTER — Ambulatory Visit: Payer: Medicare Other | Admitting: Anesthesiology

## 2017-07-09 ENCOUNTER — Ambulatory Visit
Admission: RE | Admit: 2017-07-09 | Discharge: 2017-07-09 | Disposition: A | Discharge status: Home / Self Care | Payer: Medicare Other | Source: Ambulatory Visit | Attending: Ophthalmology | Admitting: Ophthalmology

## 2017-07-09 DIAGNOSIS — H02833 Dermatochalasis of right eye, unspecified eyelid: Secondary | ICD-10-CM | POA: Insufficient documentation

## 2017-07-09 DIAGNOSIS — H02403 Unspecified ptosis of bilateral eyelids: Secondary | ICD-10-CM | POA: Diagnosis not present

## 2017-07-09 DIAGNOSIS — Z881 Allergy status to other antibiotic agents status: Secondary | ICD-10-CM | POA: Insufficient documentation

## 2017-07-09 DIAGNOSIS — G2581 Restless legs syndrome: Secondary | ICD-10-CM | POA: Diagnosis not present

## 2017-07-09 DIAGNOSIS — Z79899 Other long term (current) drug therapy: Secondary | ICD-10-CM | POA: Diagnosis not present

## 2017-07-09 DIAGNOSIS — Z96653 Presence of artificial knee joint, bilateral: Secondary | ICD-10-CM | POA: Diagnosis not present

## 2017-07-09 DIAGNOSIS — Z9841 Cataract extraction status, right eye: Secondary | ICD-10-CM | POA: Insufficient documentation

## 2017-07-09 DIAGNOSIS — K219 Gastro-esophageal reflux disease without esophagitis: Secondary | ICD-10-CM | POA: Insufficient documentation

## 2017-07-09 DIAGNOSIS — Z882 Allergy status to sulfonamides status: Secondary | ICD-10-CM | POA: Diagnosis not present

## 2017-07-09 DIAGNOSIS — Z7982 Long term (current) use of aspirin: Secondary | ICD-10-CM | POA: Diagnosis not present

## 2017-07-09 DIAGNOSIS — Z9049 Acquired absence of other specified parts of digestive tract: Secondary | ICD-10-CM | POA: Diagnosis not present

## 2017-07-09 DIAGNOSIS — Z9842 Cataract extraction status, left eye: Secondary | ICD-10-CM | POA: Diagnosis not present

## 2017-07-09 DIAGNOSIS — I10 Essential (primary) hypertension: Secondary | ICD-10-CM | POA: Diagnosis not present

## 2017-07-09 DIAGNOSIS — Z888 Allergy status to other drugs, medicaments and biological substances status: Secondary | ICD-10-CM | POA: Diagnosis not present

## 2017-07-09 DIAGNOSIS — H02836 Dermatochalasis of left eye, unspecified eyelid: Secondary | ICD-10-CM | POA: Diagnosis not present

## 2017-07-09 HISTORY — PX: BROW LIFT: SHX178

## 2017-07-09 HISTORY — DX: Presence of external hearing-aid: Z97.4

## 2017-07-09 HISTORY — DX: Restless legs syndrome: G25.81

## 2017-07-09 HISTORY — DX: Dizziness and giddiness: R42

## 2017-07-09 HISTORY — DX: Gastro-esophageal reflux disease without esophagitis: K21.9

## 2017-07-09 HISTORY — PX: BROW PTOSIS: SHX6727

## 2017-07-09 SURGERY — BLEPHAROPLASTY
Anesthesia: General | Laterality: Bilateral | Wound class: Clean

## 2017-07-09 MED ORDER — PROMETHAZINE HCL 25 MG/ML IJ SOLN: 6.2500 mg | INTRAMUSCULAR | Status: DC | PRN

## 2017-07-09 MED ORDER — LIDOCAINE-EPINEPHRINE 2 %-1:100000 IJ SOLN
INTRAMUSCULAR | Status: DC | PRN
  Administered 2017-07-09: 9 mL via OPHTHALMIC
  Administered 2017-07-09: 2 mL via OPHTHALMIC

## 2017-07-09 MED ORDER — LACTATED RINGERS IV SOLN: 10.0000 mL/h | INTRAVENOUS | Status: DC

## 2017-07-09 MED ORDER — OXYCODONE HCL 5 MG PO TABS: 5.0000 mg | ORAL_TABLET | Freq: Once | ORAL | Status: DC | PRN

## 2017-07-09 MED ORDER — MEPERIDINE HCL 25 MG/ML IJ SOLN: 6.2500 mg | INTRAMUSCULAR | Status: DC | PRN

## 2017-07-09 MED ORDER — LACTATED RINGERS IV SOLN
INTRAVENOUS | Status: DC | PRN
  Administered 2017-07-09: 12:00:00 via INTRAVENOUS

## 2017-07-09 MED ORDER — ERYTHROMYCIN 5 MG/GM OP OINT
TOPICAL_OINTMENT | OPHTHALMIC | Status: DC | PRN
  Administered 2017-07-09: 2 via OPHTHALMIC

## 2017-07-09 MED ORDER — ERYTHROMYCIN 5 MG/GM OP OINT: TOPICAL_OINTMENT | OPHTHALMIC | 3 refills | Status: DC

## 2017-07-09 MED ORDER — TETRACAINE HCL 0.5 % OP SOLN
OPHTHALMIC | Status: DC | PRN
  Administered 2017-07-09: 2 [drp] via OPHTHALMIC

## 2017-07-09 MED ORDER — ALFENTANIL 500 MCG/ML IJ INJ
INJECTION | INTRAVENOUS | Status: DC | PRN
  Administered 2017-07-09: 500 ug via INTRAVENOUS
  Administered 2017-07-09 (×2): 250 ug via INTRAVENOUS

## 2017-07-09 MED ORDER — TRAMADOL HCL 50 MG PO TABS: ORAL_TABLET | ORAL | 0 refills | Status: DC

## 2017-07-09 MED ORDER — BSS IO SOLN
INTRAOCULAR | Status: DC | PRN
  Administered 2017-07-09: 15 mL via INTRAOCULAR

## 2017-07-09 MED ORDER — FENTANYL CITRATE (PF) 100 MCG/2ML IJ SOLN: 25.0000 ug | INTRAMUSCULAR | Status: DC | PRN

## 2017-07-09 MED ORDER — OXYCODONE HCL 5 MG/5ML PO SOLN: 5.0000 mg | Freq: Once | ORAL | Status: DC | PRN

## 2017-07-09 MED ORDER — MIDAZOLAM HCL 2 MG/2ML IJ SOLN
INTRAMUSCULAR | Status: DC | PRN
  Administered 2017-07-09 (×2): 1 mg via INTRAVENOUS

## 2017-07-09 SURGICAL SUPPLY — 23 items
APPLICATOR COTTON TIP WD 3 STR (MISCELLANEOUS) ×6 IMPLANT
BLADE SURG 15 STRL LF DISP TIS (BLADE) ×2 IMPLANT
BLADE SURG 15 STRL SS (BLADE) ×4
CORD BIP STRL DISP 12FT (MISCELLANEOUS) ×3 IMPLANT
DRAPE HEAD BAR (DRAPES) ×3 IMPLANT
GAUZE SPONGE 4X4 12PLY STRL (GAUZE/BANDAGES/DRESSINGS) ×3 IMPLANT
GAUZE SPONGE NON-WVN 2X2 STRL (MISCELLANEOUS) ×10 IMPLANT
GLOVE SURG LX 7.0 MICRO (GLOVE) ×4
GLOVE SURG LX STRL 7.0 MICRO (GLOVE) ×2 IMPLANT
MARKER SKIN XFINE TIP W/RULER (MISCELLANEOUS) ×3 IMPLANT
NEEDLE FILTER BLUNT 18X 1/2SAF (NEEDLE) ×2
NEEDLE FILTER BLUNT 18X1 1/2 (NEEDLE) ×1 IMPLANT
NEEDLE HYPO 30X.5 LL (NEEDLE) ×6 IMPLANT
PACK DRAPE NASAL/ENT (PACKS) ×3 IMPLANT
SOL PREP PVP 2OZ (MISCELLANEOUS) ×3
SOLUTION PREP PVP 2OZ (MISCELLANEOUS) ×1 IMPLANT
SPONGE VERSALON 2X2 STRL (MISCELLANEOUS) ×20
SUT CHROMIC 5 0 P 3 (SUTURE) ×6 IMPLANT
SUT PLAIN GUT (SUTURE) ×3 IMPLANT
SUT PROLENE 5 0 P 3 (SUTURE) ×6 IMPLANT
SYR 3ML LL SCALE MARK (SYRINGE) ×3 IMPLANT
SYRINGE 10CC LL (SYRINGE) ×3 IMPLANT
WATER STERILE IRR 250ML POUR (IV SOLUTION) ×3 IMPLANT

## 2017-07-09 NOTE — Anesthesia Postprocedure Evaluation (Signed)
Anesthesia Post Note  Patient: Hellena Pridgen  Procedure(s) Performed: Procedure(s) (LRB): BLEPHAROPLASTY UPPER EYELID WITH EXCESS SKIN (Bilateral) BROW PTOSIS (Bilateral)  Patient location during evaluation: PACU Anesthesia Type: General Level of consciousness: awake and alert Pain management: pain level controlled Vital Signs Assessment: post-procedure vital signs reviewed and stable Respiratory status: spontaneous breathing, nonlabored ventilation, respiratory function stable and patient connected to nasal cannula oxygen Cardiovascular status: blood pressure returned to baseline and stable Postop Assessment: no apparent nausea or vomiting Anesthetic complications: no    SCOURAS, NICOLE ELAINE

## 2017-07-09 NOTE — Interval H&P Note (Signed)
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 After consideration of risks, benefits and other options for treatment, the patient has consented to  Procedure(s): BLEPHAROPLASTY UPPER EYELID WITH EXCESS SKIN (Bilateral) BROW PTOSIS (Bilateral) as a surgical intervention .  The patient's history has been reviewed, patient examined, no change in status, stable for surgery.  I have reviewed the patient's chart and labs.  Questions were answered to the patient's satisfaction.     Vickki Muff, Marcellino Fidalgo M

## 2017-07-09 NOTE — Anesthesia Procedure Notes (Signed)
Procedure Name: MAC Date/Time: 07/09/2017 12:17 PM Performed by: Cameron Ali Pre-anesthesia Checklist: Patient identified, Emergency Drugs available, Suction available, Timeout performed and Patient being monitored Patient Re-evaluated:Patient Re-evaluated prior to induction Oxygen Delivery Method: Nasal cannula Placement Confirmation: positive ETCO2

## 2017-07-09 NOTE — Anesthesia Preprocedure Evaluation (Signed)
Anesthesia Evaluation  Patient identified by MRN, date of birth, ID band Patient awake    Reviewed: Allergy & Precautions, H&P , NPO status , Patient's Chart, lab work & pertinent test results, reviewed documented beta blocker date and time   Airway Mallampati: II  TM Distance: >3 FB Neck ROM: full    Dental no notable dental hx.    Pulmonary neg pulmonary ROS,    Pulmonary exam normal breath sounds clear to auscultation       Cardiovascular Exercise Tolerance: Good hypertension, negative cardio ROS   Rhythm:regular Rate:Normal     Neuro/Psych RLS negative psych ROS   GI/Hepatic Neg liver ROS, GERD  ,  Endo/Other  negative endocrine ROS  Renal/GU negative Renal ROS  negative genitourinary   Musculoskeletal   Abdominal   Peds  Hematology negative hematology ROS (+)   Anesthesia Other Findings   Reproductive/Obstetrics negative OB ROS                             Anesthesia Physical Anesthesia Plan  ASA: II  Anesthesia Plan: General   Post-op Pain Management:    Induction:   PONV Risk Score and Plan:   Airway Management Planned:   Additional Equipment:   Intra-op Plan:   Post-operative Plan:   Informed Consent: I have reviewed the patients History and Physical, chart, labs and discussed the procedure including the risks, benefits and alternatives for the proposed anesthesia with the patient or authorized representative who has indicated his/her understanding and acceptance.   Dental Advisory Given  Plan Discussed with: CRNA  Anesthesia Plan Comments:         Anesthesia Quick Evaluation

## 2017-07-09 NOTE — Op Note (Signed)
Preoperative Diagnosis:   1.  Visually significant bilateral brow ptosis.  2.  Visually significant dermatochalasis bilateral Upper Eyelid(s)  Postoperative Diagnosis: Same.   Procedure(s) Performed:   1. Bilateral Direct brow lift to improve vision.  2.  Upper eyelid blepharoplasty with excess skin excision bilateral Upper Eyelid(s)  Teaching Surgeon: Philis Pique. Vickki Muff, M.D.   Assistants: None   Anesthesia: MAC  Specimens: None.  Estimated Blood Loss: Minimal.  Complications: None.  Operative Findings: None Dictated  PROCEDURE:   Allergies were reviewed and the patient is allergic to ciprofloxacin; oxycodone; sulfa antibiotics; and trental [pentoxifylline er]..   After the risks, benefits, complications and alternatives were discussed with the patient, appropriate informed consent was obtained. While seated in an upright position and looking in primary gaze, the amount of supra-brow skin to be removed was measured and marked in an elliptical pattern. The patient was then brought to the operating suite and reclined supine.  Timeout was conducted and the patient was sedated. Local anesthetic consisting of a 50-50 mixture of 2% lidocaine with epinephrine and 0.75% bupivacaine with added Hylenex was injected subcutaneously to the bilateral brow region(s) and down to the periosteum and  subcutaneously to both upper eyelid(s). After adequate local was instilled, the patient was prepped and draped in the usual sterile fashion for eyelid surgery.   Attention was turned to the right brow region. A #15 blade was used to create a bevelled incision along the premarked incision line. A skin and subcutaneous tissue flap was then excised and hemostasis was obtained with bipolar cautery. The deep tissues were reapproximated with interrupted vertical 5-0 chromic sutures. The skin margin was reapproximated with a running locking 5-0 Prolene suture. Attention was then turned to the opposite brow region  where the same procedure was performed in the same manner.    Attention was then turned to the upper eyelids. A 7m upper eyelid crease incision line was marked with calipers on both upper eyelid(s).  A pinch test was used to estimate the amount of excess skin to remove and this was marked in standard blepharoplasty style fashion. Attention was turned to the right upper eyelid. A #15 blade was used to open the premarked incision line. A skin only flap was excised and hemostasis was obtained with bipolar cautery.   A buttonhole was created medially in orbicularis and orbital septum to reveal the medial fat pocket. This was dissected free from fascial attachments, cauterized towards the pedicle base and excised to produce a nice flattening of the medial corner of the upper eyelid.  Attention was then turned to the opposite eyelid where the same procedure was performed in the same manner.   The skin incisions were closed with a combination of interrupted and  running 6-0 fast absorbing plain gut suture.   The patient tolerated the procedure well. Erythromycin ophthalmic ointment was applied to the incision site(s) followed by ice packs.The patient was taken to the recovery area where she recovered without difficulty.  Post-Op Plan/Instructions:  The patient was instructed to use ice packs frequently for the next 48 hours.  she  was instructed to use erythromycin ophthalmic ointment on the eyelid incisions and over-the-counter antibiotic ointment on the brow sutures 4 times a day for the next 12 to 14 days. she was given a prescription for Percocet for pain control should Tylenol not be effective. she was asked to to follow up in 10 days time for suture removal or sooner as needed for problems.   Maureen Mitchell  Dennie Maizes, M.D. Attending,Ophthalmology

## 2017-07-09 NOTE — Transfer of Care (Signed)
Immediate Anesthesia Transfer of Care Note  Patient: Maureen Mitchell  Procedure(s) Performed: Procedure(s): BLEPHAROPLASTY UPPER EYELID WITH EXCESS SKIN (Bilateral) BROW PTOSIS (Bilateral)  Patient Location: PACU  Anesthesia Type: General  Level of Consciousness: awake, alert  and patient cooperative  Airway and Oxygen Therapy: Patient Spontanous Breathing and Patient connected to supplemental oxygen  Post-op Assessment: Post-op Vital signs reviewed, Patient's Cardiovascular Status Stable, Respiratory Function Stable, Patent Airway and No signs of Nausea or vomiting  Post-op Vital Signs: Reviewed and stable  Complications: No apparent anesthesia complications

## 2017-07-09 NOTE — H&P (Signed)
See the history and physical completed at [facility] on 06/20/17 and scanned into the chart.

## 2017-07-09 NOTE — Interval H&P Note (Signed)
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 After consideration of risks, benefits and other options for treatment, the patient has consented to  Procedure(s): BLEPHAROPLASTY UPPER EYELID WITH EXCESS SKIN (Bilateral) BROW PTOSIS (Bilateral) as a surgical intervention .  The patient's history has been reviewed, patient examined, no change in status, stable for surgery.  I have reviewed the patient's chart and labs.  Questions were answered to the patient's satisfaction.     Vickki Muff, Kei Mcelhiney M

## 2017-07-10 ENCOUNTER — Encounter: Payer: Self-pay | Admitting: Ophthalmology

## 2021-08-10 ENCOUNTER — Other Ambulatory Visit: Payer: Self-pay

## 2021-08-10 ENCOUNTER — Emergency Department (HOSPITAL_COMMUNITY): Payer: Medicare Other

## 2021-08-10 ENCOUNTER — Emergency Department (HOSPITAL_COMMUNITY)
Admission: EM | Admit: 2021-08-10 | Discharge: 2021-08-10 | Disposition: A | Discharge status: Home / Self Care | Payer: Medicare Other | Attending: Emergency Medicine | Admitting: Emergency Medicine

## 2021-08-10 DIAGNOSIS — W07XXXA Fall from chair, initial encounter: Secondary | ICD-10-CM | POA: Diagnosis not present

## 2021-08-10 DIAGNOSIS — I10 Essential (primary) hypertension: Secondary | ICD-10-CM | POA: Insufficient documentation

## 2021-08-10 DIAGNOSIS — R61 Generalized hyperhidrosis: Secondary | ICD-10-CM | POA: Insufficient documentation

## 2021-08-10 DIAGNOSIS — Z79899 Other long term (current) drug therapy: Secondary | ICD-10-CM | POA: Insufficient documentation

## 2021-08-10 DIAGNOSIS — F419 Anxiety disorder, unspecified: Secondary | ICD-10-CM | POA: Diagnosis not present

## 2021-08-10 DIAGNOSIS — R55 Syncope and collapse: Secondary | ICD-10-CM | POA: Insufficient documentation

## 2021-08-10 DIAGNOSIS — Y92009 Unspecified place in unspecified non-institutional (private) residence as the place of occurrence of the external cause: Secondary | ICD-10-CM | POA: Insufficient documentation

## 2021-08-10 DIAGNOSIS — Z7982 Long term (current) use of aspirin: Secondary | ICD-10-CM | POA: Diagnosis not present

## 2021-08-10 LAB — CBC WITH DIFFERENTIAL/PLATELET
Abs Immature Granulocytes: 0.02 10*3/uL (ref 0.00–0.07)
Basophils Absolute: 0 10*3/uL (ref 0.0–0.1)
Basophils Relative: 1 %
Eosinophils Absolute: 0 10*3/uL (ref 0.0–0.5)
Eosinophils Relative: 1 %
HCT: 33.4 % — ABNORMAL LOW (ref 36.0–46.0)
Hemoglobin: 10.6 g/dL — ABNORMAL LOW (ref 12.0–15.0)
Immature Granulocytes: 0 %
Lymphocytes Relative: 23 %
Lymphs Abs: 1.4 10*3/uL (ref 0.7–4.0)
MCH: 32.1 pg (ref 26.0–34.0)
MCHC: 31.7 g/dL (ref 30.0–36.0)
MCV: 101.2 fL — ABNORMAL HIGH (ref 80.0–100.0)
Monocytes Absolute: 0.6 10*3/uL (ref 0.1–1.0)
Monocytes Relative: 10 %
Neutro Abs: 4 10*3/uL (ref 1.7–7.7)
Neutrophils Relative %: 65 %
Platelets: 159 10*3/uL (ref 150–400)
RBC: 3.3 MIL/uL — ABNORMAL LOW (ref 3.87–5.11)
RDW: 12.8 % (ref 11.5–15.5)
WBC: 6 10*3/uL (ref 4.0–10.5)
nRBC: 0 % (ref 0.0–0.2)

## 2021-08-10 LAB — COMPREHENSIVE METABOLIC PANEL
ALT: 15 U/L (ref 0–44)
AST: 21 U/L (ref 15–41)
Albumin: 3.2 g/dL — ABNORMAL LOW (ref 3.5–5.0)
Alkaline Phosphatase: 37 U/L — ABNORMAL LOW (ref 38–126)
Anion gap: 9 (ref 5–15)
BUN: 23 mg/dL (ref 8–23)
CO2: 20 mmol/L — ABNORMAL LOW (ref 22–32)
Calcium: 8.4 mg/dL — ABNORMAL LOW (ref 8.9–10.3)
Chloride: 110 mmol/L (ref 98–111)
Creatinine, Ser: 1.28 mg/dL — ABNORMAL HIGH (ref 0.44–1.00)
GFR, Estimated: 40 mL/min — ABNORMAL LOW (ref >60)
Glucose, Bld: 112 mg/dL — ABNORMAL HIGH (ref 70–99)
Potassium: 4.1 mmol/L (ref 3.5–5.1)
Sodium: 139 mmol/L (ref 135–145)
Total Bilirubin: 0.7 mg/dL (ref 0.3–1.2)
Total Protein: 5.8 g/dL — ABNORMAL LOW (ref 6.5–8.1)

## 2021-08-10 LAB — URINALYSIS, ROUTINE W REFLEX MICROSCOPIC
Bacteria, UA: NONE SEEN
Bilirubin Urine: NEGATIVE
Glucose, UA: NEGATIVE mg/dL
Hgb urine dipstick: NEGATIVE
Ketones, ur: NEGATIVE mg/dL
Nitrite: NEGATIVE
Protein, ur: NEGATIVE mg/dL
Specific Gravity, Urine: 1.01 (ref 1.005–1.030)
WBC, UA: >50 WBC/hpf — ABNORMAL HIGH (ref 0–5)
pH: 5 (ref 5.0–8.0)

## 2021-08-10 LAB — TROPONIN I (HIGH SENSITIVITY)
Troponin I (High Sensitivity): 7 ng/L (ref <18)
Troponin I (High Sensitivity): 7 ng/L (ref <18)

## 2021-08-10 LAB — LACTIC ACID, PLASMA: Lactic Acid, Venous: 1.2 mmol/L (ref 0.5–1.9)

## 2021-08-10 LAB — CK: Total CK: 51 U/L (ref 38–234)

## 2021-08-10 LAB — CBG MONITORING, ED: Glucose-Capillary: 87 mg/dL (ref 70–99)

## 2021-08-10 MED ORDER — SODIUM CHLORIDE 0.9 % IV BOLUS
1000.0000 mL | Freq: Once | INTRAVENOUS | Status: AC
  Administered 2021-08-10: 1000 mL via INTRAVENOUS

## 2021-08-10 NOTE — ED Notes (Signed)
Pt assisted to bedside commode. Granddaughter at bedside. Call light in reach

## 2021-08-10 NOTE — ED Provider Notes (Signed)
[facility] EMERGENCY DEPARTMENT Provider Note   CSN: [id number] Arrival date & time: 08/10/21  1321     History Chief Complaint  Patient presents with   Loss of Consciousness    Maureen Mitchell is a 87 y.o. female.   This is a 87 y.o. female with significant medical history as below, including hypertension, vertigo who presents to the ED with complaint of syncope. Episode occurred around 1 hr ago, lasted around 7-8 minutes, occurred while patient was sitting in chair at home. She felt light headed just prior to episode and then per family she looked very pale, diaphoretic, clammy and slumped into her chair. She was incontinent of urine and stool at this time. Following episode returned to her baseline. No observed seizure like activity by family or EMS. She does report she has not been drinking fluids much over the past few days and has had reduced oral intake 2/2 dental implant that was placed in the last few weeks. Also reports she was nervous just prior to this episode as a nurse was about to adjust her daughter's port-a-cath and she does not do well with blood/needles. She was not post-ictal following this, no n/v, no cp or dyspnea, no fevers. This has not happened before. No headache, no fall or head injury. No numbness or tingling, no speech or sensation changes.  No recent medication changes. No sick contacts or recent travel reported.   The history is provided by the patient, a relative and the EMS personnel (granddaughter).  Loss of Consciousness Episode history:  Single Most recent episode:  Today Duration:  7 minutes Progression:  Resolved Chronicity:  New Context: sight of blood   Witnessed: yes   Associated symptoms: anxiety and diaphoresis   Associated symptoms: no chest pain, no confusion, no difficulty breathing, no fever, no focal sensory loss, no focal weakness, no headaches, no nausea, no palpitations, no seizures, no shortness of breath, no visual  change and no vomiting       Past Medical History:  Diagnosis Date   GERD (gastroesophageal reflux disease)    Hearing aid worn    bilateral   Hypertension    Restless leg syndrome    Vertigo    last episode over 5 yrs ago    There are no problems to display for this patient.   Past Surgical History:  Procedure Laterality Date   BROW LIFT Bilateral 07/09/2017   Procedure: BLEPHAROPLASTY UPPER EYELID WITH EXCESS SKIN;  Surgeon: Karle Starch, MD;  Location: [facility];  Service: Ophthalmology;  Laterality: Bilateral;   BROW PTOSIS Bilateral 07/09/2017   Procedure: BROW PTOSIS;  Surgeon: Karle Starch, MD;  Location: [facility];  Service: Ophthalmology;  Laterality: Bilateral;   CHOLECYSTECTOMY     EYE SURGERY     JOINT REPLACEMENT       OB History   No obstetric history on file.     No family history on file.  Social History   Tobacco Use   Smoking status: Never   Smokeless tobacco: Never  Vaping Use   Vaping Use: Never used  Substance Use Topics   Alcohol use: No    Home Medications Prior to Admission medications   Medication Sig Start Date End Date Taking? Authorizing Provider  acetaminophen (TYLENOL) 500 MG tablet Take 500 mg by mouth at bedtime.   Yes [provider]  amitriptyline (ELAVIL) 10 MG tablet Take 10 mg by mouth at bedtime.   Yes [provider]  Ascorbic Acid (VITAMIN C PO) Take 1 tablet by mouth daily.   Yes [provider]  aspirin 81 MG tablet Take 81 mg by mouth daily.   Yes [provider]  Flaxseed, Linseed, (FLAXSEED OIL PO) Take 1 tablet by mouth daily.   Yes [provider]  hydrochlorothiazide (HYDRODIURIL) 25 MG tablet Take 25 mg by mouth daily.   Yes [provider]  lisinopril (ZESTRIL) 10 MG tablet Take 10 mg by mouth daily.   Yes [provider]  Melatonin 10 MG TABS Take 1 tablet by mouth at bedtime.   Yes [provider]  Multiple  Vitamins-Minerals (PRESERVISION AREDS 2 PO) Take by mouth 2 (two) times daily.   Yes [provider]  pramipexole (MIRAPEX) 0.125 MG tablet Take 0.125 mg by mouth daily as needed (Restless legs).   Yes [provider]  VITAMIN E PO Take 1 tablet by mouth daily.   Yes [provider]  zolpidem (AMBIEN) 10 MG tablet Take 10 mg by mouth at bedtime as needed for sleep.   Yes [provider]  erythromycin [facility]) ophthalmic ointment Use a small amount on your sutures 4 times a day for the next 2 weeks. Switch to Aquaphor ointment should allergy develop. Patient not taking: Reported on 08/10/2021 07/09/17   Karle Starch, MD  omeprazole (PRILOSEC) 20 MG capsule Take 20 mg by mouth daily. Patient not taking: Reported on 08/10/2021    [provider]  traMADol (ULTRAM) 50 MG tablet Take 1 every 4-6 hours as needed for pain not controlled by Tylenol Patient not taking: Reported on 08/10/2021 07/09/17   Karle Starch, MD    Allergies    Ciprofloxacin, Oxycodone, Sulfa antibiotics, and Trental [pentoxifylline er]  Review of Systems   Review of Systems  Constitutional:  Positive for diaphoresis. Negative for chills and fever.  HENT:  Negative for facial swelling and trouble swallowing.   Eyes:  Negative for photophobia and visual disturbance.  Respiratory:  Negative for cough and shortness of breath.   Cardiovascular:  Positive for syncope. Negative for chest pain and palpitations.  Gastrointestinal:  Negative for abdominal pain, nausea and vomiting.  Endocrine: Negative for polydipsia and polyuria.  Genitourinary:  Negative for difficulty urinating and hematuria.  Musculoskeletal:  Negative for gait problem and joint swelling.  Skin:  Negative for pallor and rash.  Neurological:  Positive for syncope. Negative for focal weakness, seizures and headaches.  Psychiatric/Behavioral:  Negative for agitation and confusion.    Physical Exam Updated Vital  Signs BP 139/71   Pulse 72   Temp 98.1 F (36.7 C) (Oral)   Resp 16   SpO2 98%   Physical Exam Vitals and nursing note reviewed.  Constitutional:      General: She is not in acute distress.    Appearance: Normal appearance.  HENT:     Head: Normocephalic and atraumatic. No raccoon eyes, Battle's sign, right periorbital erythema or left periorbital erythema.     Right Ear: External ear normal.     Left Ear: External ear normal.     Nose: Nose normal.     Mouth/Throat:     Mouth: Mucous membranes are moist.     Comments: No evidence of tongue or lip laceration  Eyes:     General: No scleral icterus.       Right eye: No discharge.        Left eye: No discharge.     Extraocular  Movements: Extraocular movements intact.     Pupils: Pupils are equal, round, and reactive to light.  Neck:     Comments: No meningismus  Cardiovascular:     Rate and Rhythm: Normal rate and regular rhythm.     Pulses: Normal pulses.     Heart sounds: Normal heart sounds.  Pulmonary:     Effort: Pulmonary effort is normal. No respiratory distress.     Breath sounds: Normal breath sounds.  Abdominal:     General: Abdomen is flat.     Tenderness: There is no abdominal tenderness.  Musculoskeletal:        General: Normal range of motion.     Cervical back: Full passive range of motion without pain and normal range of motion.     Right lower leg: No edema.     Left lower leg: No edema.  Skin:    General: Skin is warm and dry.     Capillary Refill: Capillary refill takes less than 2 seconds.  Neurological:     Mental Status: She is alert and oriented to person, place, and time. Mental status is at baseline.     GCS: GCS eye subscore is 4. GCS verbal subscore is 5. GCS motor subscore is 6.     Cranial Nerves: Cranial nerves 2-12 are intact. No facial asymmetry.     Sensory: Sensation is intact.     Motor: Motor function is intact. No tremor or seizure activity.     Coordination: Coordination is  intact. Coordination normal.     Comments: Acting at baseline per granddaughter at bedside   Psychiatric:        Mood and Affect: Mood normal.        Behavior: Behavior normal.    ED Results / Procedures / Treatments   Labs (all labs ordered are listed, but only abnormal results are displayed) Labs Reviewed  CBC WITH DIFFERENTIAL/PLATELET - Abnormal; Notable for the following components:      Result Value   RBC 3.30 (*)    Hemoglobin 10.6 (*)    HCT 33.4 (*)    MCV 101.2 (*)    All other components within normal limits  COMPREHENSIVE METABOLIC PANEL - Abnormal; Notable for the following components:   CO2 20 (*)    Glucose, Bld 112 (*)    Creatinine, Ser 1.28 (*)    Calcium 8.4 (*)    Total Protein 5.8 (*)    Albumin 3.2 (*)    Alkaline Phosphatase 37 (*)    GFR, Estimated 40 (*)    All other components within normal limits  URINALYSIS, ROUTINE W REFLEX MICROSCOPIC - Abnormal; Notable for the following components:   APPearance CLOUDY (*)    Leukocytes,Ua LARGE (*)    WBC, UA >50 (*)    Non Squamous Epithelial 0-5 (*)    All other components within normal limits  CK  LACTIC ACID, PLASMA  CBG MONITORING, ED  TROPONIN I (HIGH SENSITIVITY)  TROPONIN I (HIGH SENSITIVITY)    EKG EKG Interpretation  Date/Time:  Thursday August 10 2021 13:39:32 EDT Ventricular Rate:  75 PR Interval:  202 QRS Duration: 96 QT Interval:  421 QTC Calculation: 471 R Axis:   28 Text Interpretation: Sinus rhythm Low voltage, precordial leads Abnormal R-wave progression, early transition No STEMI Confirmed by Wynona Dove ([id number]) on 08/10/2021 2:03:14 PM  Radiology CT HEAD WO CONTRAST (5MM)  Result Date: 08/10/2021 CLINICAL DATA:  Syncope, seizure like activity EXAM: CT HEAD WITHOUT CONTRAST  TECHNIQUE: Contiguous axial images were obtained from the base of the skull through the vertex without intravenous contrast. COMPARISON:  None. FINDINGS: Brain: There are scattered hypodensities within the  periventricular white matter and bilateral basal ganglia consistent with age-indeterminate small vessel ischemic changes. No other signs of acute infarct or hemorrhage. Lateral ventricles and remaining midline structures are unremarkable. No acute extra-axial fluid collections. No mass effect. Vascular: No hyperdense vessel or unexpected calcification. Skull: Normal. Negative for fracture or focal lesion. Sinuses/Orbits: No acute finding. Other: None. IMPRESSION: 1. Scattered hypodensities within the basal ganglia and periventricular white matter most consistent with chronic small vessel ischemic change. 2. Otherwise no acute intracranial process. Electronically Signed   By: Randa Ngo M.D.   On: 08/10/2021 15:21   DG Chest Portable 1 View  Result Date: 08/10/2021 CLINICAL DATA:  Syncope. EXAM: PORTABLE CHEST 1 VIEW COMPARISON:  Prior chest radiographs 11/02/2013. FINDINGS: Heart size within normal limits. Aortic atherosclerosis. Minimal atelectasis within the lateral left lung base. The right lung is clear. No evidence of pleural effusion or pneumothorax. No acute bony abnormality identified. IMPRESSION: Minimal atelectasis within the left lung base. Aortic Atherosclerosis (ICD10-I70.0). Electronically Signed   By: Kellie Simmering D.O.   On: 08/10/2021 14:22    Procedures Procedures   Medications Ordered in ED Medications  sodium chloride 0.9 % bolus 1,000 mL (0 mLs Intravenous Stopped 08/10/21 1630)    ED Course  I have reviewed the triage vital signs and the nursing notes.  Pertinent labs & imaging results that were available during my care of the patient were reviewed by me and considered in my medical decision making (see chart for details).    MDM Rules/Calculators/A&P                          CC: syncope  This patient complains of syncope; this involves an extensive number of treatment options and is a complaint that carries with it a high risk of complications and morbidity. Vital  signs were reviewed. Serious etiologies considered.  Record review:   Previous records obtained and reviewed    Additional history obtained from granddaughter, EMS  Work up as above, notable for:  Labs & imaging results that were available during my care of the patient were reviewed by me and considered in my medical decision making.   I ordered imaging studies which included CTH CXR and I independently visualized and interpreted imaging which showed no acute process EKG without ischemic changes, troponin negative, no CP or dyspnea, ACS is unlikely.  Lafferty syncope rule- low risk  Extensive discussion with patient and family at bedside, observation was offered for further evaluation but at this time they prefer to go home. They will be able to follow up with cardiologist in the next couple days regarding symptoms. Favor vasovagal syncope as etiology of symptoms today, possibly orthostatic.   Overall she is feeling much better and is stable for discharge at this time.  The patient improved significantly and was discharged in stable condition. Detailed discussions were had with the patient regarding current findings, and need for close f/u with PCP or on call doctor. The patient has been instructed to return immediately if the symptoms worsen in any way for re-evaluation. Patient verbalized understanding and is in agreement with current care plan. All questions answered prior to discharge.             This chart was dictated using voice recognition software.  Despite best efforts to proofread,  errors can occur which can change the documentation meaning.  Final Clinical Impression(s) / ED Diagnoses Final diagnoses:  Syncope, unspecified syncope type    Rx / DC Orders ED Discharge Orders     None        Jeanell Sparrow, DO 08/11/21 1955

## 2021-08-10 NOTE — ED Notes (Signed)
Pt made aware of needing urine sample. 

## 2021-08-10 NOTE — ED Triage Notes (Signed)
BIB [facility] after pt family called to report pt slumped over in chair with positive LOC. According to EMS, pt reports poor PO intake. 400 cc given en route to hospital.   HX: HTN, macular degeneration

## 2021-08-10 NOTE — ED Provider Notes (Signed)
Patient signed out to me by previous provider. Please refer to their note for full HPI.  Briefly this is a 87 year old female who presented with the complaint of syncope.  This happened while seated while her daughter's Port-A-Cath was being accessed.  Patient does not do well with blood and needles.  Admits to decreased p.o. intake secondary to a dental implant.  Work-up thus far has been reassuring.  Cardiac work-up was negative, head CT shows no acute finding.  Blood work shows baseline anemia and creatinine.  We are pending urinalysis. Physical Exam  BP 139/71   Pulse 72   Temp 98.1 F (36.7 C) (Oral)   Resp 16   SpO2 98%   Physical Exam Vitals and nursing note reviewed.  Constitutional:      Appearance: Normal appearance.  HENT:     Head: Normocephalic.     Mouth/Throat:     Mouth: Mucous membranes are moist.  Cardiovascular:     Rate and Rhythm: Normal rate.  Pulmonary:     Effort: Pulmonary effort is normal. No respiratory distress.  Abdominal:     Palpations: Abdomen is soft.     Tenderness: There is no abdominal tenderness.  Skin:    General: Skin is warm.  Neurological:     Mental Status: She is alert and oriented to person, place, and time. Mental status is at baseline.  Psychiatric:        Mood and Affect: Mood normal.    ED Course/Procedures     Procedures  MDM   Urinalysis does not look suspicious for UTI.  Patient is otherwise feeling much better, at baseline.  Has been ambulatory and using the bedside commode without difficulty.  Discussed results with patient and granddaughter.  Feel syncope is most likely vasovagal secondary to blood/accessing her daughter's port.  Patient at this time appears safe and stable for discharge and will be treated as an outpatient.  Discharge plan and strict return to ED precautions discussed, patient verbalizes understanding and agreement.       Lorelle Gibbs, DO 08/10/21 2210

## 2021-08-10 NOTE — Discharge Instructions (Addendum)
You have been seen and discharged from the emergency department.  Your blood work was baseline for you.  Your cardiac work-up was normal.  Head CT showed no acute finding.  Stay well-hydrated.  Follow-up with your primary provider for reevaluation and further care. Take home medications as prescribed. If you have any worsening symptoms or further concerns for your health please return to an emergency department for further evaluation.

## 2021-08-10 NOTE — ED Notes (Signed)
Pt urinated in bedside commode, with slight fecal matter present.

## 2021-08-10 NOTE — ED Notes (Signed)
Pt provided with lunch bag and sprite.

## 2021-12-11 ENCOUNTER — Emergency Department (HOSPITAL_COMMUNITY): Payer: Medicare Other

## 2021-12-11 ENCOUNTER — Observation Stay (HOSPITAL_COMMUNITY)
Admission: EM | Admit: 2021-12-11 | Discharge: 2021-12-12 | Disposition: A | Discharge status: Home Health | Payer: Medicare Other | Attending: Family Medicine | Admitting: Family Medicine

## 2021-12-11 ENCOUNTER — Encounter (HOSPITAL_COMMUNITY): Payer: Self-pay | Admitting: Emergency Medicine

## 2021-12-11 DIAGNOSIS — Z7982 Long term (current) use of aspirin: Secondary | ICD-10-CM | POA: Insufficient documentation

## 2021-12-11 DIAGNOSIS — N183 Chronic kidney disease, stage 3 unspecified: Secondary | ICD-10-CM | POA: Diagnosis not present

## 2021-12-11 DIAGNOSIS — Z79899 Other long term (current) drug therapy: Secondary | ICD-10-CM | POA: Insufficient documentation

## 2021-12-11 DIAGNOSIS — I639 Cerebral infarction, unspecified: Secondary | ICD-10-CM | POA: Diagnosis not present

## 2021-12-11 DIAGNOSIS — D649 Anemia, unspecified: Secondary | ICD-10-CM | POA: Diagnosis not present

## 2021-12-11 DIAGNOSIS — I129 Hypertensive chronic kidney disease with stage 1 through stage 4 chronic kidney disease, or unspecified chronic kidney disease: Secondary | ICD-10-CM | POA: Diagnosis not present

## 2021-12-11 DIAGNOSIS — I10 Essential (primary) hypertension: Secondary | ICD-10-CM | POA: Diagnosis not present

## 2021-12-11 DIAGNOSIS — R42 Dizziness and giddiness: Secondary | ICD-10-CM

## 2021-12-11 DIAGNOSIS — D539 Nutritional anemia, unspecified: Secondary | ICD-10-CM | POA: Diagnosis present

## 2021-12-11 DIAGNOSIS — Z20822 Contact with and (suspected) exposure to covid-19: Secondary | ICD-10-CM | POA: Insufficient documentation

## 2021-12-11 LAB — CBC
HCT: 35.9 % — ABNORMAL LOW (ref 36.0–46.0)
Hemoglobin: 11.4 g/dL — ABNORMAL LOW (ref 12.0–15.0)
MCH: 31.7 pg (ref 26.0–34.0)
MCHC: 31.8 g/dL (ref 30.0–36.0)
MCV: 99.7 fL (ref 80.0–100.0)
Platelets: 196 10*3/uL (ref 150–400)
RBC: 3.6 MIL/uL — ABNORMAL LOW (ref 3.87–5.11)
RDW: 12.6 % (ref 11.5–15.5)
WBC: 6.6 10*3/uL (ref 4.0–10.5)
nRBC: 0 % (ref 0.0–0.2)

## 2021-12-11 LAB — URINALYSIS, ROUTINE W REFLEX MICROSCOPIC
Bacteria, UA: NONE SEEN
Bilirubin Urine: NEGATIVE
Glucose, UA: NEGATIVE mg/dL
Hgb urine dipstick: NEGATIVE
Ketones, ur: NEGATIVE mg/dL
Nitrite: NEGATIVE
Protein, ur: NEGATIVE mg/dL
Specific Gravity, Urine: 1.004 — ABNORMAL LOW (ref 1.005–1.030)
pH: 5 (ref 5.0–8.0)

## 2021-12-11 LAB — BASIC METABOLIC PANEL
Anion gap: 6 (ref 5–15)
BUN: 29 mg/dL — ABNORMAL HIGH (ref 8–23)
CO2: 26 mmol/L (ref 22–32)
Calcium: 9.4 mg/dL (ref 8.9–10.3)
Chloride: 104 mmol/L (ref 98–111)
Creatinine, Ser: 1.19 mg/dL — ABNORMAL HIGH (ref 0.44–1.00)
GFR, Estimated: 43 mL/min — ABNORMAL LOW (ref >60)
Glucose, Bld: 99 mg/dL (ref 70–99)
Potassium: 4.4 mmol/L (ref 3.5–5.1)
Sodium: 136 mmol/L (ref 135–145)

## 2021-12-11 LAB — RESP PANEL BY RT-PCR (FLU A&B, COVID) ARPGX2
Influenza A by PCR: NEGATIVE
Influenza B by PCR: NEGATIVE
SARS Coronavirus 2 by RT PCR: NEGATIVE

## 2021-12-11 MED ORDER — ENOXAPARIN SODIUM 40 MG/0.4ML IJ SOSY
40.0000 mg | PREFILLED_SYRINGE | INTRAMUSCULAR | Status: DC
  Administered 2021-12-12: 40 mg via SUBCUTANEOUS
  Filled 2021-12-11: qty 0.4

## 2021-12-11 MED ORDER — STROKE: EARLY STAGES OF RECOVERY BOOK
Freq: Once | Status: AC
  Filled 2021-12-11: qty 1

## 2021-12-11 MED ORDER — ACETAMINOPHEN 325 MG PO TABS: 650.0000 mg | ORAL_TABLET | ORAL | Status: DC | PRN

## 2021-12-11 MED ORDER — LORAZEPAM 1 MG PO TABS
0.5000 mg | ORAL_TABLET | Freq: Once | ORAL | Status: AC
  Administered 2021-12-11: 0.5 mg via ORAL
  Filled 2021-12-11: qty 1

## 2021-12-11 MED ORDER — PANTOPRAZOLE SODIUM 40 MG PO TBEC
40.0000 mg | DELAYED_RELEASE_TABLET | Freq: Every day | ORAL | Status: DC
  Administered 2021-12-12: 40 mg via ORAL
  Filled 2021-12-11: qty 1

## 2021-12-11 MED ORDER — ACETAMINOPHEN 160 MG/5ML PO SOLN
650.0000 mg | ORAL | Status: DC | PRN
Start: 2021-12-11 — End: 2021-12-12

## 2021-12-11 MED ORDER — ASPIRIN 325 MG PO TABS
325.0000 mg | ORAL_TABLET | Freq: Every day | ORAL | Status: DC
  Administered 2021-12-12: 325 mg via ORAL
  Filled 2021-12-11: qty 1

## 2021-12-11 MED ORDER — ZOLPIDEM TARTRATE 5 MG PO TABS
5.0000 mg | ORAL_TABLET | Freq: Every day | ORAL | Status: DC
  Administered 2021-12-12: 5 mg via ORAL
  Filled 2021-12-11: qty 1

## 2021-12-11 MED ORDER — ASPIRIN 300 MG RE SUPP: 300.0000 mg | Freq: Every day | RECTAL | Status: DC

## 2021-12-11 MED ORDER — AMITRIPTYLINE HCL 10 MG PO TABS
10.0000 mg | ORAL_TABLET | Freq: Every day | ORAL | Status: DC
  Administered 2021-12-12: 10 mg via ORAL
  Filled 2021-12-11 (×2): qty 1

## 2021-12-11 MED ORDER — ACETAMINOPHEN 650 MG RE SUPP: 650.0000 mg | RECTAL | Status: DC | PRN

## 2021-12-11 MED ORDER — HYDROCHLOROTHIAZIDE 25 MG PO TABS
25.0000 mg | ORAL_TABLET | Freq: Every day | ORAL | Status: DC
  Administered 2021-12-12: 25 mg via ORAL
  Filled 2021-12-11: qty 1

## 2021-12-11 MED ORDER — LISINOPRIL 10 MG PO TABS
10.0000 mg | ORAL_TABLET | Freq: Every day | ORAL | Status: DC
  Administered 2021-12-12: 10 mg via ORAL
  Filled 2021-12-11: qty 1

## 2021-12-11 MED ORDER — PRAMIPEXOLE DIHYDROCHLORIDE 0.125 MG PO TABS
0.1250 mg | ORAL_TABLET | Freq: Every day | ORAL | Status: DC
Start: 2021-12-11 — End: 2021-12-12
  Administered 2021-12-12: 0.125 mg via ORAL
  Filled 2021-12-11 (×2): qty 1

## 2021-12-11 NOTE — Consult Note (Signed)
Neurology Consultation  Reason for Consult: Stroke Referring Physician: Dr. Wynetta Fines, ED resident physician/Dr. Billy Fischer ED attending  CC: Dizziness  History is obtained from: Patient, chart, Maureen Mitchell-was also the caretaker.  HPI: Maureen Mitchell is a 88 y.o. female past medical history of hypertension, restless leg syndrome, presented to the emergency room for evaluation of an episode of dizziness which happened this morning somewhere around 10 AM and completely resolved.  She has had multiple episodes of what she describes as dizziness or room spinning that has happened since past Tuesday, 12/05/2021 for variable duration of time and resolved.  Today's episode happened when she was trying to load the dishwasher.  Due to her husband having had a large stroke in the past, she was concerned about neurological symptoms and came to the emergency room for evaluation.  Her exam was benign but given her recurrent symptoms which were localized to posterior circulation, an MRI was done which surprisingly did not show any posterior circulation strokes but showed subacute punctate right frontal infarcts for which neurological consultation was obtained. She denies any tingling numbness or weakness.  She denies any current symptoms.  All her deficits have resolved. Reports having had severe vertigo many years ago that has resolved but this current feeling of dizziness is different than her vertigo episodes.  Patient was seen on Friday at outpatient primary care practice and a UA was recommended to rule out a UTI.  Family concerned about possible UTI as well.   LKW: 10 AM tpa given?: no, outside the window Premorbid modified Rankin scale (mRS): 1  ROS: Full ROS was performed and is negative except as noted in the HPI.   Past Medical History:  Diagnosis Date   GERD (gastroesophageal reflux disease)    Hearing aid worn    bilateral   Hypertension    Restless leg syndrome    Vertigo    last  episode over 5 yrs ago   No family history on file.  Social History:   reports that she has never smoked. She has never used smokeless tobacco. She reports that she does not drink alcohol. No history on file for drug use.  Medications No current facility-administered medications for this encounter.  Current Outpatient Medications:    acetaminophen (TYLENOL) 500 MG tablet, Take 500 mg by mouth at bedtime., Disp: , Rfl:    amitriptyline (ELAVIL) 10 MG tablet, Take 10 mg by mouth at bedtime., Disp: , Rfl:    Ascorbic Acid (VITAMIN C PO), Take 1 tablet by mouth daily., Disp: , Rfl:    aspirin 81 MG tablet, Take 81 mg by mouth daily., Disp: , Rfl:    erythromycin (ROMYCIN) ophthalmic ointment, Use a small amount on your sutures 4 times a day for the next 2 weeks. Switch to Aquaphor ointment should allergy develop. (Patient not taking: Reported on 08/10/2021), Disp: 3.5 g, Rfl: 3   Flaxseed, Linseed, (FLAXSEED OIL PO), Take 1 tablet by mouth daily., Disp: , Rfl:    hydrochlorothiazide (HYDRODIURIL) 25 MG tablet, Take 25 mg by mouth daily., Disp: , Rfl:    lisinopril (ZESTRIL) 10 MG tablet, Take 10 mg by mouth daily., Disp: , Rfl:    Melatonin 10 MG TABS, Take 1 tablet by mouth at bedtime., Disp: , Rfl:    Multiple Vitamins-Minerals (PRESERVISION AREDS 2 PO), Take by mouth 2 (two) times daily., Disp: , Rfl:    omeprazole (PRILOSEC) 20 MG capsule, Take 20 mg by mouth daily. (Patient not taking: Reported on 08/10/2021),  Disp: , Rfl:    pramipexole (MIRAPEX) 0.125 MG tablet, Take 0.125 mg by mouth daily as needed (Restless legs)., Disp: , Rfl:    traMADol (ULTRAM) 50 MG tablet, Take 1 every 4-6 hours as needed for pain not controlled by Tylenol (Patient not taking: Reported on 08/10/2021), Disp: 6 tablet, Rfl: 0   VITAMIN E PO, Take 1 tablet by mouth daily., Disp: , Rfl:    zolpidem (AMBIEN) 10 MG tablet, Take 10 mg by mouth at bedtime as needed for sleep., Disp: , Rfl:   Exam: Current vital  signs: BP (!) 160/75    Pulse 70    Temp 99.2 F (37.3 C) (Oral)    Resp 16    SpO2 100%  Vital signs in last 24 hours: Temp:  [99.2 F (37.3 C)] 99.2 F (37.3 C) (02/20 1229) Pulse Rate:  [66-78] 70 (02/20 2000) Resp:  [13-20] 16 (02/20 1934) BP: (142-173)/(71-80) 160/75 (02/20 2000) SpO2:  [99 %-100 %] 100 % (02/20 2000)  GENERAL: Awake, alert in NAD HEENT: - Normocephalic and atraumatic, dry mm, no LN++, no Thyromegally LUNGS - Clear to auscultation bilaterally with no wheezes CV - S1S2 RRR, no m/r/g, equal pulses bilaterally. ABDOMEN - Soft, nontender, nondistended with normoactive BS Ext: warm, well perfused, intact peripheral pulses no edema  NEURO:  Mental Status: AA&Ox3  Language: speech is clear.  Naming, repetition, fluency, and comprehension intact. Cranial Nerves: PERRL. EOMI, visual fields full, no facial asymmetry, facial sensation intact, hearing diminished bilaterally, tongue/uvula/soft palate midline, normal sternocleidomastoid and trapezius muscle strength. No evidence of tongue atrophy or fibrillations Motor: 5/5 without drift in any extremity Tone: is normal and bulk is normal Sensation- Intact to light touch bilaterally Coordination: FTN intact bilaterally, no ataxia in BLE. Gait- deferred  NIHSS--0   Labs I have reviewed labs in epic and the results pertinent to this consultation are:   CBC    Component Value Date/Time   WBC 6.6 12/11/2021 1232   RBC 3.60 (L) 12/11/2021 1232   HGB 11.4 (L) 12/11/2021 1232   HGB 9.7 (L) 11/04/2013 0423   HCT 35.9 (L) 12/11/2021 1232   HCT 28.9 (L) 11/04/2013 0423   PLT 196 12/11/2021 1232   PLT 291 11/04/2013 0423   MCV 99.7 12/11/2021 1232   MCV 93 11/04/2013 0423   MCH 31.7 12/11/2021 1232   MCHC 31.8 12/11/2021 1232   RDW 12.6 12/11/2021 1232   RDW 13.2 11/04/2013 0423   LYMPHSABS 1.4 08/10/2021 1327   LYMPHSABS 1.6 10/31/2013 0444   MONOABS 0.6 08/10/2021 1327   MONOABS 2.5 (H) 10/31/2013 0444   EOSABS  0.0 08/10/2021 1327   EOSABS 0.0 10/31/2013 0444   BASOSABS 0.0 08/10/2021 1327   BASOSABS 0.1 10/31/2013 0444    CMP     Component Value Date/Time   NA 136 12/11/2021 1232   NA 133 (L) 11/01/2013 0404   K 4.4 12/11/2021 1232   K 3.8 11/01/2013 0404   CL 104 12/11/2021 1232   CL 101 11/01/2013 0404   CO2 26 12/11/2021 1232   CO2 25 11/01/2013 0404   GLUCOSE 99 12/11/2021 1232   GLUCOSE 119 (H) 11/01/2013 0404   BUN 29 (H) 12/11/2021 1232   BUN 31 (H) 11/01/2013 0404   CREATININE 1.19 (H) 12/11/2021 1232   CREATININE 1.32 (H) 11/01/2013 0404   CALCIUM 9.4 12/11/2021 1232   CALCIUM 8.5 11/01/2013 0404   PROT 5.8 (L) 08/10/2021 1327   ALBUMIN 3.2 (L) 08/10/2021 1327  AST 21 08/10/2021 1327   ALT 15 08/10/2021 1327   ALKPHOS 37 (L) 08/10/2021 1327   BILITOT 0.7 08/10/2021 1327   GFRNONAA 43 (L) 12/11/2021 1232   GFRNONAA 37 (L) 11/01/2013 0404   GFRAA 43 (L) 11/01/2013 0404   Urinalysis with trace leuk esterase, 0-5 WBCs.  Imaging I have reviewed the images obtained:  CT-head-chronic small vessel disease, no acute findings  MRI examination of the brain-2 punctate acute/subacute appearing infarcts in the right frontal lobe  Assessment:  88 year old past history of hypertension restless leg syndrome presented for evaluation of an episode of dizziness that lasted a few minutes and resolved.  MRI of the brain was done to evaluate for any posterior circulation strokes.  Interestingly enough revealed 2 punctate acute/subacute right frontal infarcts. She has had multiple symptoms of vertiginous symptoms that she has reported-I am not clear whether they are related to her current MRI findings but do raise a suspicion for possible underlying cardiac arrhythmia. Given the MRI now positive for acute/subacute strokes, would benefit from stroke risk factor work-up including that for underlying arrhythmias  Impression: Acute ischemic stroke-evaluate for  etiology   Recommendations: Admit to hospitalist Frequent neurochecks On home aspirin 81 mg-continue for now. Bedside swallow evaluation-if passes, okay for diet. Given deranged renal function, MRA head without contrast and carotid Dopplers. 2D echo A1c Lipid panel High intensity statin PT-also request Dix-Hallpike to evaluate for any peripheral causes of vertigo symptoms. OT Speech therapy If telemetry reveals atrial fibrillation, will need anticoagulation-we will defer to further testing and stroke team rounding the morning. Stroke team will follow in the morning Plan discussed with the patient and Maureen Mitchell at bedside. Plan also discussed with Dr. Jerrilyn Cairo.  -- Amie Portland, MD Neurologist [facility] Pager: [phone]

## 2021-12-11 NOTE — H&P (Signed)
History and Physical    Minh Jasper XAJ:[id number] DOB: 1927/06/30 DOA: 12/11/2021  PCP: Rusty Aus, MD  Patient coming from: Home.  Chief Complaint: Dizziness.  HPI: Maureen Mitchell is a 88 y.o. female with history of hypertension and restless leg syndrome has been experiencing dizziness off and on for the last 4 days.  The dizziness is not associated with any nausea vomiting tinnitus headache.  Last for few minutes and resolves but patient gets exhausted.  This morning around 10 AM when patient was loading dishwasher patient felt dizzy.  And since patient has been in persistent repeated episodes decided to come to the ER.  ED Course: In the ER patient appears nonfocal.  MRI of the brain shows 2 punctate foci of diffusion restriction concerning for subacute stroke.  On-call neurologist has been consulted patient admitted for further work-up.  COVID test negative EKG shows normal sinus rhythm.  Hemoglobin 1.4.  Creatinine 1.1.  Review of Systems: As per HPI, rest all negative.   Past Medical History:  Diagnosis Date   GERD (gastroesophageal reflux disease)    Hearing aid worn    bilateral   Hypertension    Restless leg syndrome    Vertigo    last episode over 5 yrs ago    Past Surgical History:  Procedure Laterality Date   BROW LIFT Bilateral 07/09/2017   Procedure: BLEPHAROPLASTY UPPER EYELID WITH EXCESS SKIN;  Surgeon: Karle Starch, MD;  Location: [place];  Service: Ophthalmology;  Laterality: Bilateral;   BROW PTOSIS Bilateral 07/09/2017   Procedure: BROW PTOSIS;  Surgeon: Karle Starch, MD;  Location: [facility];  Service: Ophthalmology;  Laterality: Bilateral;   CHOLECYSTECTOMY     EYE SURGERY     JOINT REPLACEMENT       reports that she has never smoked. She has never used smokeless tobacco. She reports that she does not drink alcohol. No history on file for drug use.  Allergies  Allergen Reactions   Ciprofloxacin Nausea Only   Oxycodone Nausea  And Vomiting   Sulfa Antibiotics Nausea Only   Tramadol     Dizziness    Trental [Pentoxifylline Er] Nausea Only    History reviewed. No pertinent family history.  Prior to Admission medications   Medication Sig Start Date End Date Taking? Authorizing Provider  acetaminophen (TYLENOL) 500 MG tablet Take 500 mg by mouth at bedtime.   Yes [provider]  amitriptyline (ELAVIL) 10 MG tablet Take 10 mg by mouth at bedtime.   Yes [provider]  Ascorbic Acid (VITAMIN C PO) Take 1,000 mg by mouth every evening.   Yes [provider]  aspirin 81 MG tablet Take 81 mg by mouth at bedtime.   Yes [provider]  Cholecalciferol (VITAMIN D-3) 125 MCG (5000 UT) TABS Take 5,000 Units by mouth every evening.   Yes [provider]  erythromycin [facility]) ophthalmic ointment Use a small amount on your sutures 4 times a day for the next 2 weeks. Switch to Aquaphor ointment should allergy develop. Patient taking differently: Place 1 application into the left eye. Every 10 weeks after eye injection x 1 day 07/09/17  Yes Karle Starch, MD  Flaxseed, Linseed, (FLAXSEED OIL PO) Take 1 tablet by mouth every evening.   Yes [provider]  hydrochlorothiazide (HYDRODIURIL) 25 MG tablet Take 25 mg by mouth daily.   Yes [provider]  lisinopril (ZESTRIL) 10 MG tablet Take 10 mg by mouth daily.  Yes [provider]  meclizine (ANTIVERT) 12.5 MG tablet Take 12.5 mg by mouth 3 (three) times daily as needed for dizziness. 12/08/21 12/18/21 Yes [provider]  Melatonin 10 MG TABS Take 10 mg by mouth at bedtime.   Yes [provider]  omeprazole (PRILOSEC) 40 MG capsule Take 40 mg by mouth every evening. 11/11/21  Yes [provider]  pramipexole (MIRAPEX) 0.125 MG tablet Take 0.125 mg by mouth at bedtime.   Yes [provider]  VITAMIN E PO Take 180 mg by mouth every evening.   Yes [provider]   zolpidem (AMBIEN) 10 MG tablet Take 10 mg by mouth at bedtime.   Yes [provider]  traMADol (ULTRAM) 50 MG tablet Take 1 every 4-6 hours as needed for pain not controlled by Tylenol Patient not taking: Reported on 08/10/2021 07/09/17   Karle Starch, MD    Physical Exam: Constitutional: Moderately built and nourished. Vitals:   12/11/21 1630 12/11/21 1934 12/11/21 1935 12/11/21 2000  BP: (!) 173/79 (!) 163/80  (!) 160/75  Pulse: 76 66 72 70  Resp: 13 16    Temp:      TempSrc:      SpO2: 100% 100% 100% 100%   Eyes: Anicteric no pallor. ENMT: No discharge from the ears eyes nose and mouth. Neck: No mass felt.  No neck rigidity. Respiratory: No rhonchi or crepitations. Cardiovascular: S1-S2 heard. Abdomen: Soft nontender bowel sound present. Musculoskeletal: No edema. Skin: No rash. Neurologic: Alert awake oriented time place and person.  Moves all extremities 5 x 5.  No facial asymmetry tongue is midline pupils are equal and reactive to light. Psychiatric: Appears normal.  Normal affect.   Labs on Admission: I have personally reviewed following labs and imaging studies  CBC: Recent Labs  Lab 12/11/21 1232  WBC 6.6  HGB 11.4*  HCT 35.9*  MCV 99.7  PLT 389   Basic Metabolic Panel: Recent Labs  Lab 12/11/21 1232  NA 136  K 4.4  CL 104  CO2 26  GLUCOSE 99  BUN 29*  CREATININE 1.19*  CALCIUM 9.4   GFR: CrCl cannot be calculated (Unknown ideal weight.). Liver Function Tests: No results for input(s): AST, ALT, ALKPHOS, BILITOT, PROT, ALBUMIN in the last 168 hours. No results for input(s): LIPASE, AMYLASE in the last 168 hours. No results for input(s): AMMONIA in the last 168 hours. Coagulation Profile: No results for input(s): INR, PROTIME in the last 168 hours. Cardiac Enzymes: No results for input(s): CKTOTAL, CKMB, CKMBINDEX, TROPONINI in the last 168 hours. BNP (last 3 results) No results for input(s): PROBNP in the last 8760 hours. HbA1C: No  results for input(s): HGBA1C in the last 72 hours. CBG: No results for input(s): GLUCAP in the last 168 hours. Lipid Profile: No results for input(s): CHOL, HDL, LDLCALC, TRIG, CHOLHDL, LDLDIRECT in the last 72 hours. Thyroid Function Tests: No results for input(s): TSH, T4TOTAL, FREET4, T3FREE, THYROIDAB in the last 72 hours. Anemia Panel: No results for input(s): VITAMINB12, FOLATE, FERRITIN, TIBC, IRON, RETICCTPCT in the last 72 hours. Urine analysis:    Component Value Date/Time   COLORURINE STRAW (A) 12/11/2021 1232   APPEARANCEUR CLEAR 12/11/2021 1232   APPEARANCEUR [place] 10/30/2013 1332   LABSPEC 1.004 (L) 12/11/2021 1232   LABSPEC 1.021 10/30/2013 1332   PHURINE 5.0 12/11/2021 1232   GLUCOSEU NEGATIVE 12/11/2021 1232   GLUCOSEU Negative 10/30/2013 1332   HGBUR NEGATIVE 12/11/2021 [place] 12/11/2021 1232  BILIRUBINUR Negative 10/30/2013 1332   KETONESUR NEGATIVE 12/11/2021 1232   PROTEINUR NEGATIVE 12/11/2021 1232   NITRITE NEGATIVE 12/11/2021 1232   LEUKOCYTESUR TRACE (A) 12/11/2021 1232   LEUKOCYTESUR Negative 10/30/2013 1332   Sepsis Labs: @LABRCNTIP (procalcitonin:4,lacticidven:4) )No results found for this or any previous visit (from the past 240 hour(s)).   Radiological Exams on Admission: MR BRAIN WO CONTRAST  Result Date: 12/11/2021 CLINICAL DATA:  Dizziness. EXAM: MRI HEAD WITHOUT CONTRAST TECHNIQUE: Multiplanar, multiecho pulse sequences of the brain and surrounding structures were obtained without intravenous contrast. COMPARISON:  Head CT 08/10/2021 FINDINGS: Brain: There are 2 punctate foci of trace diffusion weighted signal hyperintensity in the right frontal lobe, one in the white matter at the level of the centrum semiovale and one involving cortex in the middle frontal gyrus. Neither of these clearly demonstrate reduced ADC, and they may reflect subacute infarcts. T2 hyperintensities in the cerebral white matter bilaterally and in the  pons are nonspecific but compatible with mild-to-moderate chronic small vessel ischemic disease. Mild cerebral atrophy is within normal limits for age. No intracranial hemorrhage, mass, midline shift, or extra-axial fluid collection is identified. Vascular: Major intracranial vascular flow voids are preserved. Skull and upper cervical spine: Unremarkable bone marrow signal. Sinuses/Orbits: Bilateral cataract extraction. Clear paranasal sinuses. Small right mastoid effusion. Other: None. IMPRESSION: 1. Two punctate foci of diffusion abnormality in the right frontal lobe which may reflect subacute infarcts. 2. Mild-to-moderate chronic small vessel ischemic disease. Electronically Signed   By: Logan Bores M.D.   On: 12/11/2021 19:10    EKG: Independently reviewed.  Normal sinus rhythm.  Assessment/Plan Principal Problem:   Acute CVA (cerebrovascular accident) ([place]) Active Problems:   Essential hypertension   Anemia   CKD (chronic kidney disease) stage 3, GFR 30-59 ml/min (HCC)    Acute CVA -appreciate neurology consult.  At this time patient has been placed on neurochecks.  Patient has passed stroke swallow.  We will get MRI brain, 2D echo, carotid Doppler check hemoglobin A1c lipid panel patient is on aspirin.  Physical therapy consult. Hypertension -since patient's symptoms have been present for 4 days at this time neurology did not recommend any permissive hypertension.  We will continue lisinopril hydrochlorothiazide. Chronic kidney disease stage III creatinine appears to be at baseline. Anemia appears to be chronic we will check anemia panel.  Follow CBC. History of restless leg syndrome on pramipexole.   DVT prophylaxis: Lovenox. Code Status: Full code. Family Communication: Patient's granddaughter at the bedside. Disposition Plan: Home. Consults called: Neurology. Admission status: Observation.   Rise Patience MD [facility] Pager [phone].  If 7PM-7AM, please  contact night-coverage [email or web address] Password [email or web address]  12/11/2021, 10:14 PM

## 2021-12-11 NOTE — ED Notes (Signed)
Pt to MRI

## 2021-12-11 NOTE — ED Triage Notes (Addendum)
Patient with history of vertigo here with complaint of intermittent dizziness that started five days ago. Patient denies weakness. Patient is alert, oriented, and in no apparent distress at this time.  Patient seen recently by PCP for same and prescribed meclizine, patient reports improvement in dizziness with meclizine.

## 2021-12-11 NOTE — ED Provider Triage Note (Signed)
Emergency Medicine Provider Triage Evaluation Note  Maureen Mitchell , a 88 y.o. female  was evaluated in triage.  Pt complains of dizzy spells for a bout a week.  Patient has a history of vertigo, no longer on medication for this.  She was seen by her primary doctor several days ago who prescribed meclizine, she says that she has had some relief with this.  Her dizzy spells seem to have no trigger, and last several minutes before resolving.  No falls or head trauma.  Review of Systems  Positive: Dizziness, lightheadedness Negative: Falls, chest pain, shortness of breath, fever, syncope  Physical Exam  BP (!) 142/78 (BP Location: Left Arm)    Pulse 78    Resp 16    SpO2 100%  Gen:   Awake, no distress   Resp:  Normal effort  MSK:   Moves extremities without difficulty  Other:    Medical Decision Making  Medically screening exam initiated at 12:29 PM.  Appropriate orders placed.  Chan Rosasco was informed that the remainder of the evaluation will be completed by another provider, this initial triage assessment does not replace that evaluation, and the importance of remaining in the ED until their evaluation is complete.     Garo Heidelberg T, PA-C 12/11/21 1230

## 2021-12-11 NOTE — ED Provider Notes (Signed)
[facility] Provider Note  History   Chief Complaint  Patient presents with   Dizziness   The history is provided by the patient and a relative.  Dizziness Quality:  Head spinning and room spinning Severity:  Moderate Onset quality:  Sudden Duration: "few" minutes. Timing:  Intermittent Progression:  Waxing and waning Chronicity:  New Context: not with loss of consciousness   Relieved by:  Being still and closing eyes Worsened by:  Nothing Ineffective treatments:  None tried Associated symptoms: no chest pain, no diarrhea, no nausea, no palpitations, no shortness of breath and no vomiting   Risk factors: hx of vertigo     Past Medical History:  Diagnosis Date   GERD (gastroesophageal reflux disease)    Hearing aid worn    bilateral   Hypertension    Restless leg syndrome    Vertigo    last episode over 5 yrs ago    Social History   Tobacco Use   Smoking status: Never   Smokeless tobacco: Never  Vaping Use   Vaping Use: Never used  Substance Use Topics   Alcohol use: No     History reviewed. No pertinent family history.  Review of Systems  Constitutional:  Negative for chills and fever.  HENT:  Negative for congestion and sore throat.   Eyes:  Negative for photophobia and visual disturbance.  Respiratory:  Negative for cough and shortness of breath.   Cardiovascular:  Negative for chest pain and palpitations.  Gastrointestinal:  Negative for abdominal pain, diarrhea, nausea and vomiting.  Endocrine: Negative.   Genitourinary:  Negative for dysuria and hematuria.  Musculoskeletal:  Negative for neck pain and neck stiffness.  Skin:  Negative for rash and wound.  Allergic/Immunologic: Negative.   Neurological:  Positive for dizziness. Negative for seizures, syncope and speech difficulty.  Hematological: Negative.   Psychiatric/Behavioral: Negative.      Physical Exam   Today's Vitals   12/12/21 0730 12/12/21  0815 12/12/21 0911 12/12/21 1015  BP:  (!) 160/77  103/87  Pulse:  64  73  Resp:  18  19  Temp:      TempSrc:      SpO2:  98%  100%  PainSc: 0-No pain  0-No pain      Physical Exam Vitals and nursing note reviewed.  Constitutional:      General: She is not in acute distress.    Appearance: Normal appearance. She is well-developed. She is not ill-appearing or toxic-appearing.  HENT:     Head: Normocephalic and atraumatic.     Nose: Nose normal. No congestion.     Mouth/Throat:     Mouth: Mucous membranes are moist.     Pharynx: Oropharynx is clear. No oropharyngeal exudate.  Eyes:     Extraocular Movements: Extraocular movements intact.     Conjunctiva/sclera: Conjunctivae normal.     Pupils: Pupils are equal, round, and reactive to light.  Cardiovascular:     Rate and Rhythm: Normal rate and regular rhythm.     Pulses: Normal pulses.     Heart sounds: Normal heart sounds. No murmur heard. Pulmonary:     Effort: Pulmonary effort is normal. No respiratory distress.     Breath sounds: Normal breath sounds. No stridor. No wheezing, rhonchi or rales.  Abdominal:     General: Bowel sounds are normal.     Palpations: Abdomen is soft.     Tenderness: There is no abdominal tenderness. There is no  right CVA tenderness, left CVA tenderness, guarding or rebound.  Musculoskeletal:        General: No swelling.     Cervical back: Normal range of motion. No rigidity or tenderness.     Right lower leg: No edema.     Left lower leg: No edema.  Skin:    General: Skin is warm and dry.     Capillary Refill: Capillary refill takes less than 2 seconds.     Findings: No rash.  Neurological:     General: No focal deficit present.     Mental Status: She is alert and oriented to person, place, and time. Mental status is at baseline.     Cranial Nerves: No cranial nerve deficit.     Sensory: No sensory deficit.     Motor: No weakness.     Coordination: Coordination normal.  Psychiatric:         Mood and Affect: Mood normal.        Behavior: Behavior normal.     ED Course  Procedures  Medical Decision Making:  Maureen Mitchell is a 88 y.o. female w/ h/o CKD 3, HTN, HLD, GERD, vertigo who p/w recurrent dizziness.   H/o vertigo 15 yrs ago, improved with therapy at home. Unable to further elaborate. No meds for vertigo or sxs since that time (until now).  Of note, the patient's daughter recently passed away 2 weeks ago  01/01/23: Sudden onset of dizziness while standing in the kitchen unloading the dishwasher. Room spinning x minutes. Resolved with sitting/rest.   2/16: Sudden onset of dizziness while standing in the kitchen unloading the dishwasher. Room spinning x minutes. Resolved with sitting/rest.   2/17: Seen at PCP for dizziness, rx for prn meclizine  2/18: Sudden onset of dizziness while sitting in chair. Room spinning x minutes. BP 188/95 (before morning anti-HTN meds), BG 116. Took meclizine, resolved.   2/19 (today): Sudden onset of dizziness while brushing hair in bathroom. Room spinning x minutes. Ambulated w/ walker to chair to sit/rest. BP 184/102 (this was 30 min after morning anti-HTN meds). Took meclizine, resolved.   Presented to ED for eval of recurrent dizziness. Denies AC (other than ASA 81mg ). Denies fall/trauma.    On exam, patient w/o dizziness. No focal neuro deficit.  Given age and risk factors, will obtain MRI brain to eval for posterior circulation stroke.   ER provider interpretation of Imaging / Radiology:  MRI brain w/o contrast: Two punctate foci of diffusion abnormality in the right frontal lobe which may reflect subacute infarcts. Mild-to-moderate chronic small vessel ischemic disease.  ER provider interpretation of EKG:  Normal sinus rhythm with sinus arrhythmia.  Intervals appropriate.  No ST elevation or reciprocal changes.  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 Etiology spontaneous episodic vestibular syndrome, ddx includes vestibular migraine, acute labrynthitis, vestibular neuronitis, meniere's, seizures, or TIA  Consulted: Spoke with Dr. Rory Percy (On-call neurology) who recommended hospitalist admission and neurology to follow along, see their note for specific recommendations.  Patient seen in conjunction with Dr. Inez Catalina medical dictation software was used in the creation of this note.   Electronically signed by: Vikki Ports  Jerrilyn Cairo, MD on 12/12/2021 at 2:17 PM  Clinical Impression:  1. Dizziness     Dispo: Maureen Riches, MD 12/12/21 1420    Gareth Morgan, MD 12/14/21 2211

## 2021-12-12 ENCOUNTER — Observation Stay (HOSPITAL_COMMUNITY): Payer: Medicare Other

## 2021-12-12 ENCOUNTER — Observation Stay (HOSPITAL_BASED_OUTPATIENT_CLINIC_OR_DEPARTMENT_OTHER): Payer: Medicare Other

## 2021-12-12 ENCOUNTER — Other Ambulatory Visit: Payer: Self-pay | Admitting: Student

## 2021-12-12 DIAGNOSIS — I6389 Other cerebral infarction: Secondary | ICD-10-CM

## 2021-12-12 DIAGNOSIS — I10 Essential (primary) hypertension: Secondary | ICD-10-CM | POA: Diagnosis not present

## 2021-12-12 DIAGNOSIS — I639 Cerebral infarction, unspecified: Secondary | ICD-10-CM | POA: Diagnosis not present

## 2021-12-12 DIAGNOSIS — G450 Vertebro-basilar artery syndrome: Secondary | ICD-10-CM

## 2021-12-12 DIAGNOSIS — H8110 Benign paroxysmal vertigo, unspecified ear: Secondary | ICD-10-CM | POA: Diagnosis not present

## 2021-12-12 LAB — CBC
HCT: 33.7 % — ABNORMAL LOW (ref 36.0–46.0)
Hemoglobin: 11.1 g/dL — ABNORMAL LOW (ref 12.0–15.0)
MCH: 32.7 pg (ref 26.0–34.0)
MCHC: 32.9 g/dL (ref 30.0–36.0)
MCV: 99.4 fL (ref 80.0–100.0)
Platelets: 152 10*3/uL (ref 150–400)
RBC: 3.39 MIL/uL — ABNORMAL LOW (ref 3.87–5.11)
RDW: 12.7 % (ref 11.5–15.5)
WBC: 5.1 10*3/uL (ref 4.0–10.5)
nRBC: 0 % (ref 0.0–0.2)

## 2021-12-12 LAB — LIPID PANEL
Cholesterol: 172 mg/dL (ref 0–200)
HDL: 38 mg/dL — ABNORMAL LOW (ref >40)
LDL Cholesterol: 112 mg/dL — ABNORMAL HIGH (ref 0–99)
Total CHOL/HDL Ratio: 4.5 RATIO
Triglycerides: 110 mg/dL (ref <150)
VLDL: 22 mg/dL (ref 0–40)

## 2021-12-12 LAB — ECHOCARDIOGRAM COMPLETE
AR max vel: 1.11 cm2
AV Area VTI: 1.16 cm2
AV Area mean vel: 1.05 cm2
AV Mean grad: 3 mmHg
AV Peak grad: 4.8 mmHg
Ao pk vel: 1.1 m/s
Area-P 1/2: 2.18 cm2
Calc EF: 46.1 %
MV VTI: 0.16 cm2
S' Lateral: 3.3 cm
Single Plane A2C EF: 46.5 %
Single Plane A4C EF: 49.4 %

## 2021-12-12 LAB — CREATININE, SERUM
Creatinine, Ser: 1.08 mg/dL — ABNORMAL HIGH (ref 0.44–1.00)
GFR, Estimated: 48 mL/min — ABNORMAL LOW (ref >60)

## 2021-12-12 MED ORDER — CLOPIDOGREL BISULFATE 75 MG PO TABS: 75.0000 mg | ORAL_TABLET | Freq: Every day | ORAL | 0 refills | Status: AC

## 2021-12-12 MED ORDER — PRAVASTATIN SODIUM 10 MG PO TABS
20.0000 mg | ORAL_TABLET | Freq: Every day | ORAL | Status: DC
  Administered 2021-12-12: 20 mg via ORAL
  Filled 2021-12-12: qty 2

## 2021-12-12 MED ORDER — ASPIRIN EC 81 MG PO TBEC: 81.0000 mg | DELAYED_RELEASE_TABLET | Freq: Every day | ORAL | Status: DC

## 2021-12-12 MED ORDER — LORAZEPAM 2 MG/ML IJ SOLN
0.5000 mg | Freq: Once | INTRAMUSCULAR | Status: AC | PRN
  Administered 2021-12-12: 0.5 mg via INTRAVENOUS
  Filled 2021-12-12: qty 1

## 2021-12-12 MED ORDER — PRAVASTATIN SODIUM 20 MG PO TABS: 20.0000 mg | ORAL_TABLET | Freq: Every day | ORAL | 0 refills | Status: AC

## 2021-12-12 MED ORDER — ASPIRIN 81 MG PO TABS: 81.0000 mg | ORAL_TABLET | Freq: Every day | ORAL | 0 refills | Status: AC

## 2021-12-12 MED ORDER — CLOPIDOGREL BISULFATE 75 MG PO TABS
75.0000 mg | ORAL_TABLET | Freq: Every day | ORAL | Status: DC
Start: 2021-12-12 — End: 2021-12-12
  Administered 2021-12-12: 75 mg via ORAL
  Filled 2021-12-12: qty 1

## 2021-12-12 NOTE — Discharge Summary (Signed)
PatientPhysician Discharge Summary  Preston Weill QMV:[id number] DOB: [birth year] DOA: 12/11/2021  PCP: Rusty Aus, MD  Admit date: 12/11/2021 Discharge date: 12/12/2021    Admitted From: Home Disposition: Home  Recommendations for Outpatient Follow-up:  Follow up with PCP in 1-2 weeks Please obtain BMP/CBC in one week Follow-up with neurology in 1 month Please follow up with your PCP on the following pending results: Unresulted Labs (From admission, onward)     Start     Ordered   12/18/21 0500  Creatinine, serum  (enoxaparin (LOVENOX)    CrCl >/= 30 ml/min)  Weekly,   R     Comments: while on enoxaparin therapy    12/11/21 2213   12/11/21 2213  Hemoglobin A1c  (Labs)  Once,   R        12/11/21 2213              Home Health: Yes Equipment/Devices: None  Discharge Condition: Stable CODE STATUS: Full code Diet recommendation: Cardiac/low-sodium  Subjective: Seen and examined.  She has no complaints.  She is fully alert and oriented.  No dizziness.  Following HPI and ED course is copied from my admitting hospitalist colleague Dr. Moise Boring H&P. HPI: Nedda Gains is a 88 y.o. female with history of hypertension and restless leg syndrome has been experiencing dizziness off and on for the last 4 days.  The dizziness is not associated with any nausea vomiting tinnitus headache.  Last for few minutes and resolves but patient gets exhausted.  This morning around 10 AM when patient was loading dishwasher patient felt dizzy.  And since patient has been in persistent repeated episodes decided to come to the ER.   ED Course: In the ER patient appears nonfocal.  MRI of the brain shows 2 punctate foci of diffusion restriction concerning for subacute stroke.  On-call neurologist has been consulted patient admitted for further work-up.  COVID test negative EKG shows normal sinus rhythm.  Hemoglobin 1.4.  Creatinine 1.1.  Brief/Interim Summary: Patient was diagnosed with acute CVA and  admitted to hospital service, neurology consulted.  She was already on aspirin 81 mg.  Patient's symptoms was only dizziness.  No other focal deficit.  Neurology recommended adding Plavix to her aspirin, continuing DAPT for 21 days and stopping aspirin but continuing Plavix and they have added pravastatin and they cleared her for discharge.  She was assessed by OT and she was not thought to be needing OT any further but PT recommended home health PT which is being arranged for her.  She was ruled out of BPPV as well.  Interestingly, her MRI findings of acute CVA do not correlate with her symptoms of dizziness which is usually caused by vertebrobasilar CVA.  Neurology also recommended 30-day event monitor.  Cardiology was informed about this.  Patient is being discharged in stable condition.  Of note, MRI of head showed; No significant stenosis or occlusion in the anterior in superior right MCA branches. 2. Moderate distal left A2 segment stenosis. 3. Moderate stenosis of the inferior right M2 branch. 4. Moderate narrowing of the proximal right and more distal left P2 segments. 5. Moderate attenuation of distal left PCA branches Neurology aware of those findings as well.  No intervention was recommended. Carotid Doppler was negative for significant stenosis.  Discharge plan was discussed with patient and/or family member and they verbalized understanding and agreed with it.  Discharge Diagnoses:  Principal Problem:   Acute CVA (cerebrovascular accident) [facility]) Active Problems:   Essential  hypertension   Anemia   CKD (chronic kidney disease) stage 3, GFR 30-59 ml/min (HCC)    Discharge Instructions   Allergies as of 12/12/2021       Reactions   Ciprofloxacin Nausea Only   Oxycodone Nausea And Vomiting   Sulfa Antibiotics Nausea Only   Tramadol    Dizziness    Trental [pentoxifylline Er] Nausea Only        Medication List     STOP taking these medications    traMADol 50 MG  tablet Commonly known as: Ultram       TAKE these medications    acetaminophen 500 MG tablet Commonly known as: TYLENOL Take 500 mg by mouth at bedtime.   amitriptyline 10 MG tablet Commonly known as: ELAVIL Take 10 mg by mouth at bedtime.   aspirin 81 MG tablet Take 1 tablet (81 mg total) by mouth at bedtime for 21 days.   clopidogrel 75 MG tablet Commonly known as: PLAVIX Take 1 tablet (75 mg total) by mouth daily. Start taking on: December 13, 2021   erythromycin ophthalmic ointment Commonly known as: Romycin Use a small amount on your sutures 4 times a day for the next 2 weeks. Switch to Aquaphor ointment should allergy develop. What changed:  how much to take how to take this additional instructions   FLAXSEED OIL PO Take 1 tablet by mouth every evening.   hydrochlorothiazide 25 MG tablet Commonly known as: HYDRODIURIL Take 25 mg by mouth daily.   lisinopril 10 MG tablet Commonly known as: ZESTRIL Take 10 mg by mouth daily.   meclizine 12.5 MG tablet Commonly known as: ANTIVERT Take 12.5 mg by mouth 3 (three) times daily as needed for dizziness.   Melatonin 10 MG Tabs Take 10 mg by mouth at bedtime.   omeprazole 40 MG capsule Commonly known as: PRILOSEC Take 40 mg by mouth every evening.   pramipexole 0.125 MG tablet Commonly known as: MIRAPEX Take 0.125 mg by mouth at bedtime.   pravastatin 20 MG tablet Commonly known as: PRAVACHOL Take 1 tablet (20 mg total) by mouth daily. Start taking on: December 13, 2021   VITAMIN C PO Take 1,000 mg by mouth every evening.   Vitamin D-3 125 MCG (5000 UT) Tabs Take 5,000 Units by mouth every evening.   VITAMIN E PO Take 180 mg by mouth every evening.   zolpidem 10 MG tablet Commonly known as: AMBIEN Take 10 mg by mouth at bedtime.        Follow-up Information     Rusty Aus, MD Follow up in 1 week(s).   Specialty: Internal Medicine Contact information: [place] [address] [phone]                Allergies  Allergen Reactions   Ciprofloxacin Nausea Only   Oxycodone Nausea And Vomiting   Sulfa Antibiotics Nausea Only   Tramadol     Dizziness    Trental [Pentoxifylline Er] Nausea Only    Consultations: Neurology   Procedures/Studies: MR ANGIO HEAD WO CONTRAST  Result Date: 12/12/2021 CLINICAL DATA:  Right frontal lobe infarcts. Abnormal MRI. Hypertension. Intermittent dizziness. EXAM: MRA HEAD WITHOUT CONTRAST TECHNIQUE: Angiographic images of the Circle of Willis were acquired using MRA technique without intravenous contrast. COMPARISON:  MRI of the brain without contrast 12/11/2021. FINDINGS: Anterior circulation: Mild atherosclerotic irregularity is present within the cavernous internal carotid arteries bilaterally without significant stenosis through the ICA termini. The  A1 and M1 segments are normal. Mild irregularity is present in the A2 segments bilaterally with moderate distal left A2 segment stenosis. No significant proximal stenosis or occlusion is present on the right. M1 segments are within normal limits. MCA bifurcations are unremarkable. Moderate stenosis is present in the inferior right M2 branch. Distal segmental narrowing is present bilaterally. Posterior circulation: The vertebral arteries are codominant. Right PICA origin is visualized and within normal limits. The left AICA is dominant. Vertebrobasilar junction is normal. The basilar artery is within normal limits. Both posterior cerebral arteries are of fetal type. Moderate narrowing is present in the proximal right and more distal left P2 segments. There is significant attenuation of distal PCA branches on the left. Anatomic variants: Fetal type posterior cerebral arteries bilaterally. Other: None. IMPRESSION: 1. No significant stenosis or occlusion in the anterior in superior right MCA branches. 2. Moderate distal left A2 segment  stenosis. 3. Moderate stenosis of the inferior right M2 branch. 4. Moderate narrowing of the proximal right and more distal left P2 segments. 5. Moderate attenuation of distal left PCA branches. Electronically Signed   By: San Morelle M.D.   On: 12/12/2021 08:56   MR BRAIN WO CONTRAST  Result Date: 12/11/2021 CLINICAL DATA:  Dizziness. EXAM: MRI HEAD WITHOUT CONTRAST TECHNIQUE: Multiplanar, multiecho pulse sequences of the brain and surrounding structures were obtained without intravenous contrast. COMPARISON:  Head CT 08/10/2021 FINDINGS: Brain: There are 2 punctate foci of trace diffusion weighted signal hyperintensity in the right frontal lobe, one in the white matter at the level of the centrum semiovale and one involving cortex in the middle frontal gyrus. Neither of these clearly demonstrate reduced ADC, and they may reflect subacute infarcts. T2 hyperintensities in the cerebral white matter bilaterally and in the pons are nonspecific but compatible with mild-to-moderate chronic small vessel ischemic disease. Mild cerebral atrophy is within normal limits for age. No intracranial hemorrhage, mass, midline shift, or extra-axial fluid collection is identified. Vascular: Major intracranial vascular flow voids are preserved. Skull and upper cervical spine: Unremarkable bone marrow signal. Sinuses/Orbits: Bilateral cataract extraction. Clear paranasal sinuses. Small right mastoid effusion. Other: None. IMPRESSION: 1. Two punctate foci of diffusion abnormality in the right frontal lobe which may reflect subacute infarcts. 2. Mild-to-moderate chronic small vessel ischemic disease. Electronically Signed   By: Logan Bores M.D.   On: 12/11/2021 19:10   ECHOCARDIOGRAM COMPLETE  Result Date: 12/12/2021    ECHOCARDIOGRAM REPORT   Patient Name:   TIERRIA WATSON Date of Exam: 12/12/2021 Medical Rec #:  [id number]   Height:       68.0 in Accession #:    [id number]  Weight:       225.0 lb Date of Birth:  July 21, 1927   BSA:          2.149 m Patient Age:    59 years    BP:           134/73 mmHg Patient Gender: F           HR:           65 bpm. Exam Location:  Inpatient Procedure: 2D Echo, Cardiac Doppler and Color Doppler Indications:    I63.9 STROKE  History:        Patient has no prior history of Echocardiogram examinations.                 Risk Factors:Hypertension.  Sonographer:    Beryle Beams Referring Phys: [place]  1.  Left ventricular ejection fraction, by estimation, is 45 to 50%. The left ventricle has mildly decreased function. The left ventricle demonstrates global hypokinesis. Left ventricular diastolic parameters are consistent with Grade I diastolic dysfunction (impaired relaxation).  2. Right ventricular systolic function is normal. The right ventricular size is normal. Tricuspid regurgitation signal is inadequate for assessing PA pressure.  3. Left atrial size was mildly dilated.  4. The mitral valve is rheumatic. Mild mitral valve regurgitation. Mild mitral stenosis. The mean mitral valve gradient is 4.3 mmHg with average heart rate of 77 bpm. Moderate to severe mitral annular calcification.  5. The tricuspid valve is rheumatic.  6. The aortic valve is tricuspid. There is mild calcification of the aortic valve. Aortic valve regurgitation is not visualized. Aortic valve sclerosis is present, with no evidence of aortic valve stenosis.  7. The inferior vena cava is normal in size with greater than 50% respiratory variability, suggesting right atrial pressure of 3 mmHg. FINDINGS  Left Ventricle: Left ventricular ejection fraction, by estimation, is 45 to 50%. The left ventricle has mildly decreased function. The left ventricle demonstrates global hypokinesis. The left ventricular internal cavity size was normal in size. There is  no left ventricular hypertrophy. Left ventricular diastolic parameters are consistent with Grade I diastolic dysfunction (impaired relaxation). Right  Ventricle: The right ventricular size is normal. No increase in right ventricular wall thickness. Right ventricular systolic function is normal. Tricuspid regurgitation signal is inadequate for assessing PA pressure. Left Atrium: Left atrial size was mildly dilated. Right Atrium: Right atrial size was normal in size. Pericardium: There is no evidence of pericardial effusion. Mitral Valve: The mitral valve is rheumatic. There is mild thickening of the mitral valve leaflet(s). Moderate to severe mitral annular calcification. Mild mitral valve regurgitation, with eccentric posteriorly directed jet. Mild mitral valve stenosis. MV peak gradient, 10.6 mmHg. The mean mitral valve gradient is 4.3 mmHg with average heart rate of 77 bpm. 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 Tricuspid valve regurgitation is trivial. Aortic Valve: The aortic valve is tricuspid. There is mild calcification of the aortic valve. Aortic valve regurgitation is not visualized. Aortic valve sclerosis is present, with no evidence of aortic valve stenosis. Aortic valve mean gradient measures 3.0 mmHg. Aortic valve peak gradient measures 4.8 mmHg. Aortic valve area, by VTI measures 1.16 cm. Pulmonic Valve: The pulmonic valve was normal in structure. Pulmonic valve regurgitation is not visualized. Aorta: The aortic root and ascending aorta are structurally normal, with no evidence of dilitation. Venous: The inferior vena cava is normal in size with greater than 50% respiratory variability, suggesting right atrial pressure of 3 mmHg. IAS/Shunts: The interatrial septum was not well visualized.  LEFT VENTRICLE PLAX 2D LVIDd:         4.80 cm LVIDs:         3.30 cm LV PW:         1.00 cm LV IVS:        0.80 cm LVOT diam:     1.60 cm LV SV:         31 LV SV Index:   14 LVOT Area:     2.01 cm  LV Volumes (MOD) LV vol d, MOD A2C: 63.7 ml LV vol d, MOD A4C: 80.3 ml LV vol s, MOD A2C: 34.1 ml LV vol s, MOD A4C: 40.6 ml LV SV MOD A2C:     29.6 ml LV  SV MOD A4C:     80.3 ml LV SV MOD BP:  33.5 ml RIGHT VENTRICLE             IVC RV S prime:     12.20 cm/s  IVC diam: 1.60 cm TAPSE (M-mode): 2.0 cm LEFT ATRIUM             Index        RIGHT ATRIUM           Index LA diam:        3.80 cm 1.77 cm/m   RA Area:     12.20 cm LA Vol (A2C):   44.7 ml 20.80 ml/m  RA Volume:   25.60 ml  11.91 ml/m LA Vol (A4C):   23.1 ml 10.75 ml/m LA Biplane Vol: 34.8 ml 16.20 ml/m  AORTIC VALVE                    PULMONIC VALVE AV Area (Vmax):    1.11 cm     PV Vmax:       0.59 m/s AV Area (Vmean):   1.05 cm     PV Vmean:      42.100 cm/s AV Area (VTI):     1.16 cm     PV VTI:        0.149 m AV Vmax:           110.00 cm/s  PV Peak grad:  1.4 mmHg AV Vmean:          81.200 cm/s  PV Mean grad:  1.0 mmHg AV VTI:            0.268 m AV Peak Grad:      4.8 mmHg AV Mean Grad:      3.0 mmHg LVOT Vmax:         60.60 cm/s LVOT Vmean:        42.600 cm/s LVOT VTI:          0.154 m LVOT/AV VTI ratio: 0.57  AORTA Ao Root diam: 2.90 cm Ao Asc diam:  2.70 cm MITRAL VALVE MV Area (PHT): 2.18 cm     SHUNTS MV Area VTI:                Systemic VTI:  0.15 m MV Peak grad:  10.6 mmHg    Systemic Diam: 1.60 cm MV Mean grad:  4.3 mmHg MV Vmax:       1.63 m/s MV Vmean:      370.0 cm/s MV Decel Time: 348 msec MV E velocity: 106.00 cm/s MV A velocity: 127.00 cm/s MV E/A ratio:  0.83 Mihai Croitoru MD Electronically signed by Sanda Klein MD Signature Date/Time: 12/12/2021/10:58:07 AM    Final    VAS US CAROTID (at [facility] and [facility] only)  Result Date: 12/12/2021 Carotid Arterial Duplex Study Patient Name:  SHWETA AMAN  Date of Exam:   12/12/2021 Medical Rec #: [id number]    Accession #:    [id number] Date of Birth: 1927-08-19   Patient Gender: F Patient Age:   89 years Exam Location:  [facility] Procedure:      VAS US CAROTID Referring Phys: Gean Birchwood --------------------------------------------------------------------------------  Indications:       CVA. Risk Factors:      Hypertension.  Comparison Study:  no prior Performing Technologist: Archie Patten RVS  Examination Guidelines: A complete evaluation includes B-mode imaging, spectral Doppler, color Doppler, and power Doppler as needed of all accessible portions of each vessel. Bilateral testing is considered an integral part of a complete examination.  Limited examinations for reoccurring indications may be performed as noted.  Right Carotid Findings: +----------+--------+--------+--------+------------------+--------+             PSV cm/s EDV cm/s Stenosis Plaque Description Comments  +----------+--------+--------+--------+------------------+--------+  CCA Prox   83       15                heterogenous                 +----------+--------+--------+--------+------------------+--------+  CCA Distal 68       12                heterogenous                 +----------+--------+--------+--------+------------------+--------+  ICA Prox   78       17       1-39%    heterogenous                 +----------+--------+--------+--------+------------------+--------+  ICA Distal 80       16                                             +----------+--------+--------+--------+------------------+--------+  ECA        83                                                      +----------+--------+--------+--------+------------------+--------+ +----------+--------+-------+--------+-------------------+             PSV cm/s EDV cms Describe Arm Pressure (mmHG)  +----------+--------+-------+--------+-------------------+  Subclavian 390              Stenotic                      +----------+--------+-------+--------+-------------------+ +---------+--------+--+--------+--+---------+  Vertebral PSV cm/s 62 EDV cm/s 11 Antegrade  +---------+--------+--+--------+--+---------+  Left Carotid Findings: +----------+--------+--------+--------+--------------------------+--------+             PSV cm/s EDV cm/s Stenosis Plaque Description         Comments   +----------+--------+--------+--------+--------------------------+--------+  CCA Prox   105      15                heterogenous                         +----------+--------+--------+--------+--------------------------+--------+  CCA Distal 63       11                heterogenous                         +----------+--------+--------+--------+--------------------------+--------+  ICA Prox   191      49       40-59%   heterogenous and irregular           +----------+--------+--------+--------+--------------------------+--------+  ICA Distal 89       21                                                     +----------+--------+--------+--------+--------------------------+--------+  ECA        71                                                              +----------+--------+--------+--------+--------------------------+--------+ +----------+--------+--------+--------+-------------------+             PSV cm/s EDV cm/s Describe Arm Pressure (mmHG)  +----------+--------+--------+--------+-------------------+  Subclavian 66                                              +----------+--------+--------+--------+-------------------+ +---------+--------+--+--------+--+---------+  Vertebral PSV cm/s 85 EDV cm/s 12 Antegrade  +---------+--------+--+--------+--+---------+   Summary: Right Carotid: Velocities in the right ICA are consistent with a 1-39% stenosis. Left Carotid: Velocities in the left ICA are consistent with a 40-59% stenosis. Vertebrals:  Bilateral vertebral arteries demonstrate antegrade flow. Subclavians: Right subclavian artery was stenotic. *See table(s) above for measurements and observations.     Preliminary      Discharge Exam: Vitals:   12/12/21 0815 12/12/21 1015  BP: (!) 160/77 103/87  Pulse: 64 73  Resp: 18 19  Temp:    SpO2: 98% 100%   Vitals:   12/12/21 0645 12/12/21 0700 12/12/21 0815 12/12/21 1015  BP: (!) 143/64 134/73 (!) 160/77 103/87  Pulse: 71 68 64 73  Resp: 18 17 18 19   Temp:       TempSrc:      SpO2: 95% 98% 98% 100%    General: Pt is alert, awake, not in acute distress Cardiovascular: RRR, S1/S2 +, no rubs, no gallops Respiratory: CTA bilaterally, no wheezing, no rhonchi Abdominal: Soft, NT, ND, bowel sounds + Extremities: no edema, no cyanosis    The results of significant diagnostics from this hospitalization (including imaging, microbiology, ancillary and laboratory) are listed below for reference.     Microbiology: Recent Results (from the past 240 hour(s))  Resp Panel by RT-PCR (Flu A&B, Covid) Nasopharyngeal Swab     Status: None   Collection Time: 12/11/21 10:41 PM   Specimen: Nasopharyngeal Swab; Nasopharyngeal(NP) swabs in vial transport medium  Result Value Ref Range Status   SARS Coronavirus 2 by RT PCR NEGATIVE NEGATIVE Final    Comment: (NOTE) SARS-CoV-2 target nucleic acids are NOT DETECTED.  The SARS-CoV-2 RNA is generally detectable in upper respiratory specimens during the acute phase of infection. The lowest concentration of SARS-CoV-2 viral copies this assay can detect is 138 copies/mL. A negative result does not preclude SARS-Cov-2 infection and should not be used as the sole basis for treatment or other patient management decisions. A negative result may occur with  improper specimen collection/handling, submission of specimen other than nasopharyngeal swab, presence of viral mutation(s) within the areas targeted by this assay, and inadequate number of viral copies(<138 copies/mL). A negative result must be combined with clinical observations, patient history, and epidemiological information. The expected result is Negative.  Fact Sheet for Patients:  EntrepreneurPulse.com.au  Fact Sheet for Healthcare Providers:  IncredibleEmployment.be  This test is no t yet approved or cleared by the [place] FDA and  has been authorized for detection and/or diagnosis of SARS-CoV-2 by FDA under an  Emergency Use Authorization (EUA). This EUA will remain  in effect (meaning  this test can be used) for the duration of the COVID-19 declaration under Section 564(b)(1) of the Act, 21 U.S.C.section 360bbb-3(b)(1), unless the authorization is terminated  or revoked sooner.       Influenza A by PCR NEGATIVE NEGATIVE Final   Influenza B by PCR NEGATIVE NEGATIVE Final    Comment: (NOTE) The Xpert Xpress SARS-CoV-2/FLU/RSV plus assay is intended as an aid in the diagnosis of influenza from Nasopharyngeal swab specimens and should not be used as a sole basis for treatment. Nasal washings and aspirates are unacceptable for Xpert Xpress SARS-CoV-2/FLU/RSV testing.  Fact Sheet for Patients: EntrepreneurPulse.com.au  Fact Sheet for Healthcare Providers: IncredibleEmployment.be  This test is not yet approved or cleared by the [place] FDA and has been authorized for detection and/or diagnosis of SARS-CoV-2 by FDA under an Emergency Use Authorization (EUA). This EUA will remain in effect (meaning this test can be used) for the duration of the COVID-19 declaration under Section 564(b)(1) of the Act, 21 U.S.C. section 360bbb-3(b)(1), unless the authorization is terminated or revoked.  Performed at [facility], [place] [address], [place], [place] [address]      Labs: BNP (last 3 results) No results for input(s): BNP in the last 8760 hours. Basic Metabolic Panel: Recent Labs  Lab 12/11/21 1232 12/12/21 0755  NA 136  --   K 4.4  --   CL 104  --   CO2 26  --   GLUCOSE 99  --   BUN 29*  --   CREATININE 1.19* 1.08*  CALCIUM 9.4  --    Liver Function Tests: No results for input(s): AST, ALT, ALKPHOS, BILITOT, PROT, ALBUMIN in the last 168 hours. No results for input(s): LIPASE, AMYLASE in the last 168 hours. No results for input(s): AMMONIA in the last 168 hours. CBC: Recent Labs  Lab 12/11/21 1232 12/12/21 0755  WBC 6.6 5.1  HGB  11.4* 11.1*  HCT 35.9* 33.7*  MCV 99.7 99.4  PLT 196 152   Cardiac Enzymes: No results for input(s): CKTOTAL, CKMB, CKMBINDEX, TROPONINI in the last 168 hours. BNP: Invalid input(s): POCBNP CBG: No results for input(s): GLUCAP in the last 168 hours. D-Dimer No results for input(s): DDIMER in the last 72 hours. Hgb A1c No results for input(s): HGBA1C in the last 72 hours. Lipid Profile Recent Labs    12/12/21 0755  CHOL 172  HDL 38*  LDLCALC 112*  TRIG 110  CHOLHDL 4.5   Thyroid function studies No results for input(s): TSH, T4TOTAL, T3FREE, THYROIDAB in the last 72 hours.  Invalid input(s): FREET3 Anemia work up No results for input(s): VITAMINB12, FOLATE, FERRITIN, TIBC, IRON, RETICCTPCT in the last 72 hours. Urinalysis    Component Value Date/Time   COLORURINE STRAW (A) 12/11/2021 1232   APPEARANCEUR CLEAR 12/11/2021 1232   APPEARANCEUR Hazy 10/30/2013 1332   LABSPEC 1.004 (L) 12/11/2021 1232   LABSPEC 1.021 10/30/2013 1332   PHURINE 5.0 12/11/2021 1232   GLUCOSEU NEGATIVE 12/11/2021 1232   GLUCOSEU Negative 10/30/2013 1332   HGBUR NEGATIVE 12/11/2021 1232   BILIRUBINUR NEGATIVE 12/11/2021 1232   BILIRUBINUR Negative 10/30/2013 1332   KETONESUR NEGATIVE 12/11/2021 1232   PROTEINUR NEGATIVE 12/11/2021 1232   NITRITE NEGATIVE 12/11/2021 1232   LEUKOCYTESUR TRACE (A) 12/11/2021 1232   LEUKOCYTESUR Negative 10/30/2013 1332   Sepsis Labs Invalid input(s): PROCALCITONIN,  WBC,  LACTICIDVEN Microbiology Recent Results (from the past 240 hour(s))  Resp Panel by RT-PCR (Flu A&B, Covid) Nasopharyngeal Swab  Status: None   Collection Time: 12/11/21 10:41 PM   Specimen: Nasopharyngeal Swab; Nasopharyngeal(NP) swabs in vial transport medium  Result Value Ref Range Status   SARS Coronavirus 2 by RT PCR NEGATIVE NEGATIVE Final    Comment: (NOTE) SARS-CoV-2 target nucleic acids are NOT DETECTED.  The SARS-CoV-2 RNA is generally detectable in upper  respiratory specimens during the acute phase of infection. The lowest concentration of SARS-CoV-2 viral copies this assay can detect is 138 copies/mL. A negative result does not preclude SARS-Cov-2 infection and should not be used as the sole basis for treatment or other patient management decisions. A negative result may occur with  improper specimen collection/handling, submission of specimen other than nasopharyngeal swab, presence of viral mutation(s) within the areas targeted by this assay, and inadequate number of viral copies(<138 copies/mL). A negative result must be combined with clinical observations, patient history, and epidemiological information. The expected result is Negative.  Fact Sheet for Patients:  EntrepreneurPulse.com.au  Fact Sheet for Healthcare Providers:  IncredibleEmployment.be  This test is no t yet approved or cleared by the [place] FDA and  has been authorized for detection and/or diagnosis of SARS-CoV-2 by FDA under an Emergency Use Authorization (EUA). This EUA will remain  in effect (meaning this test can be used) for the duration of the COVID-19 declaration under Section 564(b)(1) of the Act, 21 U.S.C.section 360bbb-3(b)(1), unless the authorization is terminated  or revoked sooner.       Influenza A by PCR NEGATIVE NEGATIVE Final   Influenza B by PCR NEGATIVE NEGATIVE Final    Comment: (NOTE) The Xpert Xpress SARS-CoV-2/FLU/RSV plus assay is intended as an aid in the diagnosis of influenza from Nasopharyngeal swab specimens and should not be used as a sole basis for treatment. Nasal washings and aspirates are unacceptable for Xpert Xpress SARS-CoV-2/FLU/RSV testing.  Fact Sheet for Patients: EntrepreneurPulse.com.au  Fact Sheet for Healthcare Providers: IncredibleEmployment.be  This test is not yet approved or cleared by the [place] FDA and has been  authorized for detection and/or diagnosis of SARS-CoV-2 by FDA under an Emergency Use Authorization (EUA). This EUA will remain in effect (meaning this test can be used) for the duration of the COVID-19 declaration under Section 564(b)(1) of the Act, 21 U.S.C. section 360bbb-3(b)(1), unless the authorization is terminated or revoked.  Performed at [facility], [place] [address] [place], [place] [address]      Time coordinating discharge: Over 30 minutes  SIGNED:   Darliss Cheney, MD  [facility] 12/12/2021, 12:24 PM  If 7PM-7AM, please contact night-coverage [email or web address]

## 2021-12-12 NOTE — Progress Notes (Addendum)
STROKE TEAM PROGRESS NOTE   INTERVAL HISTORY Her granddaughter/caretaker is at the bedside. The pair report that patient has had three instances where she experienced dizziness with the room spinning, both standing and seated. Granddaughter reports that 1-2 were while patient was bending down to empty dishwasher. They deny any speech changes, weakness, facial droop, vision changes, or headache.   CTH no acute abnormalities. MRI with 2 punctate foci of infarct in R frontal lobe. MRA with stenotic R subclavian artery and L ICA 40-59% stenotic.   Vitals:   12/12/21 0645 12/12/21 0700 12/12/21 0815 12/12/21 1015  BP: (!) 143/64 134/73 (!) 160/77 103/87  Pulse: 71 68 64 73  Resp: 18 17 18 19   Temp:      TempSrc:      SpO2: 95% 98% 98% 100%   CBC:  Recent Labs  Lab 12/11/21 1232 12/12/21 0755  WBC 6.6 5.1  HGB 11.4* 11.1*  HCT 35.9* 33.7*  MCV 99.7 99.4  PLT 196 329   Basic Metabolic Panel:  Recent Labs  Lab 12/11/21 1232 12/12/21 0755  NA 136  --   K 4.4  --   CL 104  --   CO2 26  --   GLUCOSE 99  --   BUN 29*  --   CREATININE 1.19* 1.08*  CALCIUM 9.4  --    Lipid Panel:  Recent Labs  Lab 12/12/21 0755  CHOL 172  TRIG 110  HDL 38*  CHOLHDL 4.5  VLDL 22  LDLCALC 112*   HgbA1c: No results for input(s): HGBA1C in the last 168 hours. Urine Drug Screen: No results for input(s): LABOPIA, COCAINSCRNUR, LABBENZ, AMPHETMU, THCU, LABBARB in the last 168 hours.  Alcohol Level No results for input(s): ETH in the last 168 hours.  IMAGING past 24 hours MR ANGIO HEAD WO CONTRAST  Result Date: 12/12/2021 CLINICAL DATA:  Right frontal lobe infarcts. Abnormal MRI. Hypertension. Intermittent dizziness. EXAM: MRA HEAD WITHOUT CONTRAST TECHNIQUE: Angiographic images of the Circle of Willis were acquired using MRA technique without intravenous contrast. COMPARISON:  MRI of the brain without contrast 12/11/2021. FINDINGS: Anterior circulation: Mild atherosclerotic irregularity is  present within the cavernous internal carotid arteries bilaterally without significant stenosis through the ICA termini. The A1 and M1 segments are normal. Mild irregularity is present in the A2 segments bilaterally with moderate distal left A2 segment stenosis. No significant proximal stenosis or occlusion is present on the right. M1 segments are within normal limits. MCA bifurcations are unremarkable. Moderate stenosis is present in the inferior right M2 branch. Distal segmental narrowing is present bilaterally. Posterior circulation: The vertebral arteries are codominant. Right PICA origin is visualized and within normal limits. The left AICA is dominant. Vertebrobasilar junction is normal. The basilar artery is within normal limits. Both posterior cerebral arteries are of fetal type. Moderate narrowing is present in the proximal right and more distal left P2 segments. There is significant attenuation of distal PCA branches on the left. Anatomic variants: Fetal type posterior cerebral arteries bilaterally. Other: None. IMPRESSION: 1. No significant stenosis or occlusion in the anterior in superior right MCA branches. 2. Moderate distal left A2 segment stenosis. 3. Moderate stenosis of the inferior right M2 branch. 4. Moderate narrowing of the proximal right and more distal left P2 segments. 5. Moderate attenuation of distal left PCA branches. Electronically Signed   By: San Morelle M.D.   On: 12/12/2021 08:56   MR BRAIN WO CONTRAST  Result Date: 12/11/2021 CLINICAL DATA:  Dizziness. EXAM: MRI  HEAD WITHOUT CONTRAST TECHNIQUE: Multiplanar, multiecho pulse sequences of the brain and surrounding structures were obtained without intravenous contrast. COMPARISON:  Head CT 08/10/2021 FINDINGS: Brain: There are 2 punctate foci of trace diffusion weighted signal hyperintensity in the right frontal lobe, one in the white matter at the level of the centrum semiovale and one involving cortex in the middle  frontal gyrus. Neither of these clearly demonstrate reduced ADC, and they may reflect subacute infarcts. T2 hyperintensities in the cerebral white matter bilaterally and in the pons are nonspecific but compatible with mild-to-moderate chronic small vessel ischemic disease. Mild cerebral atrophy is within normal limits for age. No intracranial hemorrhage, mass, midline shift, or extra-axial fluid collection is identified. Vascular: Major intracranial vascular flow voids are preserved. Skull and upper cervical spine: Unremarkable bone marrow signal. Sinuses/Orbits: Bilateral cataract extraction. Clear paranasal sinuses. Small right mastoid effusion. Other: None. IMPRESSION: 1. Two punctate foci of diffusion abnormality in the right frontal lobe which may reflect subacute infarcts. 2. Mild-to-moderate chronic small vessel ischemic disease. Electronically Signed   By: Logan Bores M.D.   On: 12/11/2021 19:10   ECHOCARDIOGRAM COMPLETE  Result Date: 12/12/2021    ECHOCARDIOGRAM REPORT   Patient Name:   Maureen Mitchell Date of Exam: 12/12/2021 Medical Rec #:  [id number]   Height:       68.0 in Accession #:    [id number]  Weight:       225.0 lb Date of Birth:  02-09-1927  BSA:          2.149 m Patient Age:    88 years    BP:           134/73 mmHg Patient Gender: F           HR:           65 bpm. Exam Location:  Inpatient Procedure: 2D Echo, Cardiac Doppler and Color Doppler Indications:    I63.9 STROKE  History:        Patient has no prior history of Echocardiogram examinations.                 Risk Factors:Hypertension.  Sonographer:    Beryle Beams Referring Phys: [place]  1. Left ventricular ejection fraction, by estimation, is 45 to 50%. The left ventricle has mildly decreased function. The left ventricle demonstrates global hypokinesis. Left ventricular diastolic parameters are consistent with Grade I diastolic dysfunction (impaired relaxation).  2. Right ventricular systolic function is  normal. The right ventricular size is normal. Tricuspid regurgitation signal is inadequate for assessing PA pressure.  3. Left atrial size was mildly dilated.  4. The mitral valve is rheumatic. Mild mitral valve regurgitation. Mild mitral stenosis. The mean mitral valve gradient is 4.3 mmHg with average heart rate of 77 bpm. Moderate to severe mitral annular calcification.  5. The tricuspid valve is rheumatic.  6. The aortic valve is tricuspid. There is mild calcification of the aortic valve. Aortic valve regurgitation is not visualized. Aortic valve sclerosis is present, with no evidence of aortic valve stenosis.  7. The inferior vena cava is normal in size with greater than 50% respiratory variability, suggesting right atrial pressure of 3 mmHg. FINDINGS  Left Ventricle: Left ventricular ejection fraction, by estimation, is 45 to 50%. The left ventricle has mildly decreased function. The left ventricle demonstrates global hypokinesis. The left ventricular internal cavity size was normal in size. There is  no left ventricular hypertrophy. Left ventricular diastolic parameters are consistent with  Grade I diastolic dysfunction (impaired relaxation). Right Ventricle: The right ventricular size is normal. No increase in right ventricular wall thickness. Right ventricular systolic function is normal. Tricuspid regurgitation signal is inadequate for assessing PA pressure. Left Atrium: Left atrial size was mildly dilated. Right Atrium: Right atrial size was normal in size. Pericardium: There is no evidence of pericardial effusion. Mitral Valve: The mitral valve is rheumatic. There is mild thickening of the mitral valve leaflet(s). Moderate to severe mitral annular calcification. Mild mitral valve regurgitation, with eccentric posteriorly directed jet. Mild mitral valve stenosis. MV peak gradient, 10.6 mmHg. The mean mitral valve gradient is 4.3 mmHg with average heart rate of 77 bpm. Tricuspid Valve: The tricuspid valve  is rheumatic. Tricuspid valve regurgitation is trivial. Aortic Valve: The aortic valve is tricuspid. There is mild calcification of the aortic valve. Aortic valve regurgitation is not visualized. Aortic valve sclerosis is present, with no evidence of aortic valve stenosis. Aortic valve mean gradient measures 3.0 mmHg. Aortic valve peak gradient measures 4.8 mmHg. Aortic valve area, by VTI measures 1.16 cm. Pulmonic Valve: The pulmonic valve was normal in structure. Pulmonic valve regurgitation is not visualized. Aorta: The aortic root and ascending aorta are structurally normal, with no evidence of dilitation. Venous: The inferior vena cava is normal in size with greater than 50% respiratory variability, suggesting right atrial pressure of 3 mmHg. IAS/Shunts: The interatrial septum was not well visualized.  LEFT VENTRICLE PLAX 2D LVIDd:         4.80 cm LVIDs:         3.30 cm LV PW:         1.00 cm LV IVS:        0.80 cm LVOT diam:     1.60 cm LV SV:         31 LV SV Index:   14 LVOT Area:     2.01 cm  LV Volumes (MOD) LV vol d, MOD A2C: 63.7 ml LV vol d, MOD A4C: 80.3 ml LV vol s, MOD A2C: 34.1 ml LV vol s, MOD A4C: 40.6 ml LV SV MOD A2C:     29.6 ml LV SV MOD A4C:     80.3 ml LV SV MOD BP:      33.5 ml RIGHT VENTRICLE             IVC RV S prime:     12.20 cm/s  IVC diam: 1.60 cm TAPSE (M-mode): 2.0 cm LEFT ATRIUM             Index        RIGHT ATRIUM           Index LA diam:        3.80 cm 1.77 cm/m   RA Area:     12.20 cm LA Vol (A2C):   44.7 ml 20.80 ml/m  RA Volume:   25.60 ml  11.91 ml/m LA Vol (A4C):   23.1 ml 10.75 ml/m LA Biplane Vol: 34.8 ml 16.20 ml/m  AORTIC VALVE                    PULMONIC VALVE AV Area (Vmax):    1.11 cm     PV Vmax:       0.59 m/s AV Area (Vmean):   1.05 cm     PV Vmean:      42.100 cm/s AV Area (VTI):     1.16 cm     PV VTI:  0.149 m AV Vmax:           110.00 cm/s  PV Peak grad:  1.4 mmHg AV Vmean:          81.200 cm/s  PV Mean grad:  1.0 mmHg AV VTI:             0.268 m AV Peak Grad:      4.8 mmHg AV Mean Grad:      3.0 mmHg LVOT Vmax:         60.60 cm/s LVOT Vmean:        42.600 cm/s LVOT VTI:          0.154 m LVOT/AV VTI ratio: 0.57  AORTA Ao Root diam: 2.90 cm Ao Asc diam:  2.70 cm MITRAL VALVE MV Area (PHT): 2.18 cm     SHUNTS MV Area VTI:                Systemic VTI:  0.15 m MV Peak grad:  10.6 mmHg    Systemic Diam: 1.60 cm MV Mean grad:  4.3 mmHg MV Vmax:       1.63 m/s MV Vmean:      370.0 cm/s MV Decel Time: 348 msec MV E velocity: 106.00 cm/s MV A velocity: 127.00 cm/s MV E/A ratio:  0.83 Mihai Croitoru MD Electronically signed by Sanda Klein MD Signature Date/Time: 12/12/2021/10:58:07 AM    Final    VAS US CAROTID (at [facility] and [facility] only)  Result Date: 12/12/2021 Carotid Arterial Duplex Study Patient Name:  KASHAWNA MANZER  Date of Exam:   12/12/2021 Medical Rec #: [id number]    Accession #:    [id number] Date of Birth: 1927/10/15   Patient Gender: F Patient Age:   9 years Exam Location:  [facility] Procedure:      VAS US CAROTID Referring Phys: Gean Birchwood --------------------------------------------------------------------------------  Indications:       CVA. Risk Factors:      Hypertension. Comparison Study:  no prior Performing Technologist: Archie Patten RVS  Examination Guidelines: A complete evaluation includes B-mode imaging, spectral Doppler, color Doppler, and power Doppler as needed of all accessible portions of each vessel. Bilateral testing is considered an integral part of a complete examination. Limited examinations for reoccurring indications may be performed as noted.  Right Carotid Findings: +----------+--------+--------+--------+------------------+--------+             PSV cm/s EDV cm/s Stenosis Plaque Description Comments  +----------+--------+--------+--------+------------------+--------+  CCA Prox   83       15                heterogenous                 +----------+--------+--------+--------+------------------+--------+  CCA  Distal 68       12                heterogenous                 +----------+--------+--------+--------+------------------+--------+  ICA Prox   78       17       1-39%    heterogenous                 +----------+--------+--------+--------+------------------+--------+  ICA Distal 80       16                                             +----------+--------+--------+--------+------------------+--------+  ECA        83                                                      +----------+--------+--------+--------+------------------+--------+ +----------+--------+-------+--------+-------------------+             PSV cm/s EDV cms Describe Arm Pressure (mmHG)  +----------+--------+-------+--------+-------------------+  Subclavian 390              Stenotic                      +----------+--------+-------+--------+-------------------+ +---------+--------+--+--------+--+---------+  Vertebral PSV cm/s 62 EDV cm/s 11 Antegrade  +---------+--------+--+--------+--+---------+  Left Carotid Findings: +----------+--------+--------+--------+--------------------------+--------+             PSV cm/s EDV cm/s Stenosis Plaque Description         Comments  +----------+--------+--------+--------+--------------------------+--------+  CCA Prox   105      15                heterogenous                         +----------+--------+--------+--------+--------------------------+--------+  CCA Distal 63       11                heterogenous                         +----------+--------+--------+--------+--------------------------+--------+  ICA Prox   191      49       40-59%   heterogenous and irregular           +----------+--------+--------+--------+--------------------------+--------+  ICA Distal 89       21                                                     +----------+--------+--------+--------+--------------------------+--------+  ECA        71                                                               +----------+--------+--------+--------+--------------------------+--------+ +----------+--------+--------+--------+-------------------+             PSV cm/s EDV cm/s Describe Arm Pressure (mmHG)  +----------+--------+--------+--------+-------------------+  Subclavian 66                                              +----------+--------+--------+--------+-------------------+ +---------+--------+--+--------+--+---------+  Vertebral PSV cm/s 85 EDV cm/s 12 Antegrade  +---------+--------+--+--------+--+---------+   Summary: Right Carotid: Velocities in the right ICA are consistent with a 1-39% stenosis. Left Carotid: Velocities in the left ICA are consistent with a 40-59% stenosis. Vertebrals:  Bilateral vertebral arteries demonstrate antegrade flow. Subclavians: Right subclavian artery was stenotic. *See table(s) above for measurements and observations.     Preliminary     PHYSICAL EXAM General - Well nourished, well developed,  elderly woman in no apparent distress.   Mental Status -  Level of arousal and orientation to time, place, and person were intact. Language including expression, naming, repetition, comprehension was assessed and found intact. Attention span and concentration were normal. Recent and remote memory were intact. Fund of Knowledge was assessed and was intact.  Cranial Nerves II - XII - II - Visual field intact OU. III, IV, VI - Extraocular movements intact. V - Facial sensation intact bilaterally. VII - Facial movement intact bilaterally. VIII - Hearing & vestibular with baseline deficits bilaterally. Hearing aids present in both ears X - Palate elevates symmetrically. XI - Chin turning & shoulder shrug intact bilaterally. XII - Tongue protrusion intact.  Motor Strength - The patients strength was normal in all extremities and pronator drift was absent.  Bulk was normal and fasciculations were absent.   Motor Tone - Muscle tone was assessed at the neck and appendages and was  normal.  Sensory - Light touch was assessed and symmetrical.    Coordination - The patient had normal movements in the hands and feet with no ataxia or dysmetria.  Tremor was absent.  Gait and Station - deferred.  ASSESSMENT/PLAN [name] is a 88 y.o. female with history of GERD, HTN, restless leg syndrome, vertigo presenting with dizziness.   Stroke: Two punctate right frontal lobe infarcts present, cardioembolic vs. Small vessel disease.  Code Stroke CT head: Hypodensities of basal ganglia and periventricular white matter c/w chronic small vessel ischemic changes. No acute abnormalities. MRI  Two punctate foci of diffusion abnormality in R frontal lobe, subacute infarcts. Mild-mod chronic small vessel ischemic disease.  MRA  Mod distal L A2 and inferior R M2 stenosis; Mod narrowing of proximal R and distal L P2; Mod attenuation of Distal L PCA. 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 LDL 112 HgbA1c pending VTE prophylaxis - SCDs aspirin 81 mg daily prior to admission, now on aspirin 81 mg daily and clopidogrel 75 mg daily x 3 weeks then Plavix alone.  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 Home med: Meclizine 12.5 mg TID PRN. No episodes in 15 years.  This time symptoms not quite typical for BPPV and Dix-Hallpike test negative per PT Orthostatic vital stable without hypotension per OT Symptoms could be related to right subclavian artery stenosis in the  setting of hypotension Educated on patient and family about getting up slowly and no standing for long period time.  Also BP monitoring at home.  Hypertension Home meds:  HCTZ 25 mg, Lisinopril 10 mg Stable Avoid low BP Long-term BP goal normotensive  Hyperlipidemia Home meds:  N/A LDL 112, goal < 70 Add Pravastatin 20 mg  Continue statin at discharge  Other Stroke Risk Factors Advanced Age >/= 3   Other Active Problems   Hospital day # 0   Rosezetta Schlatter, MD PGY-1 Neuro Psych Resident 12/12/2021  1:27 PM   ATTENDING NOTE: I reviewed above note and agree with the assessment and plan. Pt was seen and examined.  88 year old female with history of hypertension, RLS, BPPV 2008 admitted for episode of vertigo/dizziness for the last 3 to 4 days, each episode lasting about 4 to 5 minutes.  Mostly it happened during standing position, resolved/improved after sitting down.  MRI no posterior circulation infarct however 2 punctate left frontal infarcts.  MRA head and neck mild to moderate intracranial stenosis.  Carotid Doppler left ICA 40 to 59% stenosis and right subclavian artery stenosis.  EF 45 to 50%, LDL 112 and A1c pending.  Creatinine 1.19, UA negative.  On exam, patient neurologically intact, no neurodeficit.  Hard of hearing.  PT did Dix-Hallpike test which was negative.  OT did orthostatic vitals and no orthostatic hypotension.  Etiology for patient stroke not quite clear, could be cardioembolic given to separate location versus small vessel disease given punctate size and intracranial atherosclerosis.  Recommend 30-day CardioNet monitoring to rule out A-fib.  Continue aspirin 81 and Plavix 75 DAPT for 3 weeks and then Plavix alone.  Continue pravastatin 20.  PT/OT recommend home health.  Patient presenting symptoms of vertigo/dizziness could be BPPV given history but Dix-Hallpike negative so far.  Also could be due to vertebrobasilar insufficiency in the setting of orthostatic  hypotension given subclavian artery stenosis and occurred during standing.  Recommend maintain adequate hydration, sitting and standing slowly, avoid standing too long, BP monitor at home and TED hose.    For detailed assessment and plan, please refer to above as I have made changes wherever appropriate.   Neurology will sign off. Please call with questions. Pt will follow up with stroke clinic NP at [facility] in about 4 weeks. Thanks for the consult.   Rosalin Hawking, MD PhD Stroke Neurology 12/12/2021 2:42 PM    To contact Stroke Continuity provider, please refer to [email or web address] After hours, contact General Neurology

## 2021-12-12 NOTE — Progress Notes (Signed)
° °  Echocardiogram 2D Echocardiogram has been performed.  Maureen Mitchell 12/12/2021, 8:20 AM

## 2021-12-12 NOTE — Progress Notes (Signed)
Transition of Care [facility]) - Emergency Department Mini Assessment   Patient Details  Name: Maureen Mitchell MRN: [id number] Date of Birth: 1927-03-12  Transition of Care [facility]) CM/SW Contact:    Fuller Mandril, RN Phone Number: 12/12/2021, 3:32 PM   Clinical Narrative: Jason Coop. Clydene Laming, RN, BSN, [place] [phone] RNCM spoke with pt and grand daughter at bedside regarding discharge planning for [place]. Offered pt medicare.gov list of home health agencies to choose from.  Pt would like to look over list and choose either today or tomorrow.  RNCM provided my call back number to set up home health.  No DME needs identified at this time.    ED Mini Assessment: What brought you to the Emergency Department? : (P) Dizziness  Barriers to Discharge: (P) Other (must enter comment) (PT evaluation)     Means of departure: (P) Car       Patient Contact and Communications        ,          Patient states their goals for this hospitalization and ongoing recovery are:: (P) Go home and get PT to be stronger      Admission diagnosis:  Acute CVA (cerebrovascular accident) [facility]) [I63.9] Patient Active Problem List   Diagnosis Date Noted   Acute CVA (cerebrovascular accident) ([place]) 12/11/2021   Essential hypertension 12/11/2021   Anemia 12/11/2021   CKD (chronic kidney disease) stage 3, GFR 30-59 ml/min ([place]) 12/11/2021   PCP:  Rusty Aus, MD Pharmacy:   [facility] [address], [place] - [place] [address] [place] [address] Phone: [phone] Fax: [place], [place] [facility] [address] [place] [address] Phone: [phone] Fax: [phone]

## 2021-12-12 NOTE — Progress Notes (Signed)
Carotid duplex has been completed.   Preliminary results in CV Proc.   Yonatan Guitron [place] 12/12/2021 11:22 AM

## 2021-12-12 NOTE — Evaluation (Signed)
Occupational Therapy Evaluation Patient Details Name: Maureen Mitchell MRN: [id number] DOB: December 28, 1926 Today's Date: 12/12/2021   History of Present Illness Pt is a 88 y/o female presenting on 2/20 due to dizziness. CT negative, MRI with two punctate acute/subacute infarcts in R frontal lobe.  PMH includes: HTN, restless leg syndrome, vertigo.   Clinical Impression   PTA patient independent with ADLs, mobility, family assist with IADLs.  Admitted for above and presenting near baseline at supervision to modified independent level for ADLs, mobility. Cognition, vision, strength WFL.  No dizziness during session, with BP stable in all positions.  Patient educated on safety and fall prevention, increased time with positional changes. Granddaughter at side throughout session.  Based on performance, no further OT needs identified at this time and OT will sign off.  Thank you for this referral.      Recommendations for follow up therapy are one component of a multi-disciplinary discharge planning process, led by the attending physician.  Recommendations may be updated based on patient status, additional functional criteria and insurance authorization.   Follow Up Recommendations  No OT follow up    Assistance Recommended at Discharge PRN  Patient can return home with the following A little help with walking and/or transfers;A little help with bathing/dressing/bathroom;Direct supervision/assist for medications management;Assistance with cooking/housework    Functional Status Assessment  Patient has not had a recent decline in their functional status  Equipment Recommendations  None recommended by OT    Recommendations for Other Services PT consult     Precautions / Restrictions Precautions Precautions: Fall Restrictions Weight Bearing Restrictions: No      Mobility Bed Mobility Overal bed mobility: Independent                  Transfers                           Balance Overall balance assessment: Mild deficits observed, not formally tested                                         ADL either performed or assessed with clinical judgement   ADL Overall ADL's : Needs assistance/impaired     Grooming: Modified independent;Bed level           Upper Body Dressing : Modified independent;Sitting   Lower Body Dressing: Supervision/safety;Sit to/from stand   Toilet Transfer: Supervision/safety;Ambulation           Functional mobility during ADLs: Supervision/safety;Modified independent General ADL Comments: supervision to modified independent with mobility in room and hallway     Vision Baseline Vision/History: 1 Wears glasses;6 Macular Degeneration Ability to See in Adequate Light: 1 Impaired Patient Visual Report: No change from baseline Vision Assessment?: No apparent visual deficits     Perception     Praxis      Pertinent Vitals/Pain Pain Assessment Pain Assessment: No/denies pain     Hand Dominance Right   Extremity/Trunk Assessment Upper Extremity Assessment Upper Extremity Assessment: Overall WFL for tasks assessed   Lower Extremity Assessment Lower Extremity Assessment: Defer to PT evaluation       Communication Communication Communication: HOH   Cognition Arousal/Alertness: Awake/alert Behavior During Therapy: WFL for tasks assessed/performed Overall Cognitive Status: Within Functional Limits for tasks assessed  General Comments  VSS    Exercises     Shoulder Instructions      Home Living Family/patient expects to be discharged to:: Private residence Living Arrangements: Other relatives (granddaughter and her family) Available Help at Discharge: Family;Available 24 hours/day Type of Home: House Home Access: Stairs to enter [employer] of Steps: 1   Home Layout: One level     Bathroom Shower/Tub: Medical illustrator: Standard (with BSC over toilet)     Home Equipment: [place] (2 wheels);Rollator (4 wheels);Cane - single point;BSC/3in1;Shower seat;Wheelchair - manual          Prior Functioning/Environment Prior Level of Function : Needs assist       Physical Assist : ADLs (physical)   ADLs (physical): IADLs Mobility Comments: independent ADLs Comments: assist with cooking and placing meds in pill box- otherwise independent        OT Problem List:        OT Treatment/Interventions:      OT Goals(Current goals can be found in the care plan section) Acute Rehab OT Goals Patient Stated Goal: get home OT Goal Formulation: With patient Time For Goal Achievement: 12/26/21 Potential to Achieve Goals: Good  OT Frequency:      Co-evaluation              AM-PAC OT "6 Clicks" Daily Activity     Outcome Measure Help from another person eating meals?: None Help from another person taking care of personal grooming?: None Help from another person toileting, which includes using toliet, bedpan, or urinal?: None Help from another person bathing (including washing, rinsing, drying)?: None Help from another person to put on and taking off regular upper body clothing?: None Help from another person to put on and taking off regular lower body clothing?: None 6 Click Score: 24   End of Session Equipment Utilized During Treatment: Gait belt Nurse Communication: Mobility status  Activity Tolerance: Patient tolerated treatment well Patient left: in bed;with call bell/phone within reach  OT Visit Diagnosis: Dizziness and giddiness (R42)                Time: 4970-2637 OT Time Calculation (min): 23 min Charges:  OT General Charges $OT Visit: 1 Visit OT Evaluation $OT Eval Low Complexity: 1 Low OT Treatments $Self Care/Home Management : 8-22 mins  Jolaine Artist, OT Acute Rehabilitation Services Pager [phone] Office [phone]   [name] 12/12/2021,  11:04 AM

## 2021-12-12 NOTE — Evaluation (Signed)
Physical Therapy Evaluation Patient Details Name: Maureen Mitchell MRN: [id number] DOB: 06-08-1927 Today's Date: 12/12/2021  History of Present Illness  Pt is a 88 y/o female presenting on 2/20 due to dizziness. CT negative, MRI with two punctate acute/subacute infarcts in R frontal lobe.  PMH includes: HTN, restless leg syndrome, vertigo.  Clinical Impression  Patient presented to the ED on 12/11/21 with 5 days of on/off dizziness. Pt's impairments include decreased balance and generalized weakness that are limiting her ability to safely and independently ambulate. Patient requires supervision to min guard assist with all functional mobility for safety purposes. SPT discussed and educated patient and granddaughter on benefits of home health PT vs outpatient; granddaughter would like to discuss with family before deciding. Discussed using RW more frequently at home upon D/C for safety. PT will continue to follow acutely to maximize patient's mobility.        Recommendations for follow up therapy are one component of a multi-disciplinary discharge planning process, led by the attending physician.  Recommendations may be updated based on patient status, additional functional criteria and insurance authorization.  Follow Up Recommendations Home health PT (vs outpatient PT pending family decision)    Assistance Recommended at Discharge Frequent or constant Supervision/Assistance  Patient can return home with the following  A little help with walking and/or transfers;Assistance with cooking/housework;Direct supervision/assist for medications management;Assist for transportation;Help with stairs or ramp for entrance    Equipment Recommendations None recommended by PT  Recommendations for Other Services       Functional Status Assessment Patient has had a recent decline in their functional status and demonstrates the ability to make significant improvements in function in a reasonable and predictable  amount of time.     Precautions / Restrictions Precautions Precautions: Fall Restrictions Weight Bearing Restrictions: No      Mobility  Bed Mobility Overal bed mobility: Needs Assistance Bed Mobility: Supine to Sit, Sit to Supine     Supine to sit: Supervision Sit to supine: Supervision   General bed mobility comments: supervision needed for safety; takes increased time to perform.    Transfers Overall transfer level: Needs assistance Equipment used: None Transfers: Sit to/from Stand Sit to Stand: Min guard, From elevated surface           General transfer comment: min guard for steadying upon standing    Ambulation/Gait Ambulation/Gait assistance: Min guard, Supervision Gait Distance (Feet): 50 Feet Assistive device: None Gait Pattern/deviations: Step-to pattern, Decreased step length - right, Decreased step length - left, Decreased stride length, Antalgic Gait velocity: decreased Gait velocity interpretation: <1.31 ft/sec, indicative of household ambulator   General Gait Details: pt had an antalgic gait pattern with decreased foot clearance. Pt reported she "walks a little funny since her bilateral knee surgeries". Pt able to perform DGI tasks, however, decreased her gait speed with all tasks with increased sway with tasks.  Stairs            Wheelchair Mobility    Modified Rankin (Stroke Patients Only) Modified Rankin (Stroke Patients Only) Pre-Morbid Rankin Score: No significant disability Modified Rankin: Moderately severe disability     Balance Overall balance assessment: Needs assistance Sitting-balance support: Bilateral upper extremity supported, Feet supported Sitting balance-Leahy Scale: Good Sitting balance - Comments: requires BUE support with sitting, however very stable in sitting on EOB   Standing balance support: No upper extremity supported, During functional activity Standing balance-Leahy Scale: Fair Standing balance comment:  increased sway noted in standing balance.  Standardized Balance Assessment Standardized Balance Assessment : Dynamic Gait Index   Dynamic Gait Index Level Surface: Moderate Impairment Change in Gait Speed: Moderate Impairment Gait with Horizontal Head Turns: Moderate Impairment Gait with Vertical Head Turns: Moderate Impairment Gait and Pivot Turn: Moderate Impairment Step Over Obstacle: Moderate Impairment Step Around Obstacles: Moderate Impairment       Pertinent Vitals/Pain Pain Assessment Pain Assessment: Faces Faces Pain Scale: No hurt    Home Living Family/patient expects to be discharged to:: Private residence Living Arrangements: Other relatives Available Help at Discharge: Family;Available 24 hours/day Type of Home: House Home Access: Stairs to enter   [employer] of Steps: 1   Home Layout: One level Home Equipment: Conservation officer, nature (2 wheels);Cane - single point;Wheelchair - manual;BSC/3in1;Shower seat      Prior Function Prior Level of Function : Needs assist       Physical Assist : ADLs (physical)   ADLs (physical): IADLs Mobility Comments: mod I with increased time ADLs Comments: assist with cooking and placing meds in pill box- otherwise independent     Hand Dominance   Dominant Hand: Right    Extremity/Trunk Assessment   Upper Extremity Assessment Upper Extremity Assessment: Defer to OT evaluation    Lower Extremity Assessment Lower Extremity Assessment: Generalized weakness    Cervical / Trunk Assessment Cervical / Trunk Assessment: Kyphotic  Communication   Communication: HOH (has hearing aides in bilaterally but still HOH)  Cognition Arousal/Alertness: Awake/alert Behavior During Therapy: WFL for tasks assessed/performed Overall Cognitive Status: Within Functional Limits for tasks assessed                                          General Comments General comments (skin integrity,  edema, etc.): Pt had no symptoms of dizziness throughout mobility assessment. PT and SPT performed Epley maneauver on pt due to vestibular type symptoms pt experienced at home. Pt was negative for nystagmus and pt had no provocations of dizziness.    Exercises     Assessment/Plan    PT Assessment Patient needs continued PT services  PT Problem List Decreased strength;Decreased balance;Decreased mobility;Decreased safety awareness       PT Treatment Interventions Gait training;Stair training;Functional mobility training;Therapeutic activities;Balance training;Therapeutic exercise;Neuromuscular re-education;Patient/family education    PT Goals (Current goals can be found in the Care Plan section)  Acute Rehab PT Goals Patient Stated Goal: to return home PT Goal Formulation: With patient/family Time For Goal Achievement: 12/26/21 Potential to Achieve Goals: Good    Frequency Min 4X/week     Co-evaluation               AM-PAC PT "6 Clicks" Mobility  Outcome Measure Help needed turning from your back to your side while in a flat bed without using bedrails?: None Help needed moving from lying on your back to sitting on the side of a flat bed without using bedrails?: A Little Help needed moving to and from a bed to a chair (including a wheelchair)?: A Little Help needed standing up from a chair using your arms (e.g., wheelchair or bedside chair)?: A Little Help needed to walk in hospital room?: A Little Help needed climbing 3-5 steps with a railing? : A Little 6 Click Score: 19    End of Session Equipment Utilized During Treatment: Gait belt Activity Tolerance: Patient tolerated treatment well Patient left: in bed;with call bell/phone within reach;with family/visitor  present (on stretcher in ED) Nurse Communication: Mobility status PT Visit Diagnosis: Unsteadiness on feet (R26.81);Difficulty in walking, not elsewhere classified (R26.2);Other abnormalities of gait and mobility  (R26.89);Dizziness and giddiness (R42);Muscle weakness (generalized) (M62.81)    Time: 1712-7871 PT Time Calculation (min) (ACUTE ONLY): 25 min   Charges:   PT Evaluation $PT Eval Low Complexity: 1 Low PT Treatments $Gait Training: 8-22 mins        Jonne Ply, SPT  [place] 12/12/2021, 1:47 PM

## 2021-12-12 NOTE — Progress Notes (Signed)
Ordered outpatient Event Monitor for further evaluation of CVA at the request of Neurology. Patient has never been seen by [place] so we will also arrange a New Patient visit with Dr. Harl Bowie (Doctor of the Day) to discuss monitor results.   Darreld Mclean, PA-C 12/12/2021 12:33 PM

## 2021-12-13 LAB — HEMOGLOBIN A1C
Hgb A1c MFr Bld: 5.4 % (ref 4.8–5.6)
Mean Plasma Glucose: 108 mg/dL

## 2021-12-20 ENCOUNTER — Ambulatory Visit (INDEPENDENT_AMBULATORY_CARE_PROVIDER_SITE_OTHER): Payer: Medicare Other

## 2021-12-20 DIAGNOSIS — I4891 Unspecified atrial fibrillation: Secondary | ICD-10-CM | POA: Diagnosis not present

## 2021-12-20 DIAGNOSIS — R42 Dizziness and giddiness: Secondary | ICD-10-CM | POA: Diagnosis not present

## 2021-12-20 DIAGNOSIS — I639 Cerebral infarction, unspecified: Secondary | ICD-10-CM

## 2022-01-18 ENCOUNTER — Encounter: Payer: Self-pay | Admitting: Internal Medicine

## 2022-01-25 ENCOUNTER — Ambulatory Visit: Payer: PRIVATE HEALTH INSURANCE | Admitting: Internal Medicine

## 2022-01-25 ENCOUNTER — Ambulatory Visit (INDEPENDENT_AMBULATORY_CARE_PROVIDER_SITE_OTHER): Payer: Medicare Other | Admitting: Internal Medicine

## 2022-01-25 ENCOUNTER — Encounter: Payer: Self-pay | Admitting: Internal Medicine

## 2022-01-25 VITALS — BP 124/64 | HR 74 | Ht 67.0 in | Wt 190.6 lb

## 2022-01-25 DIAGNOSIS — I639 Cerebral infarction, unspecified: Secondary | ICD-10-CM | POA: Diagnosis not present

## 2022-01-25 NOTE — Progress Notes (Signed)
?Cardiology Office Note:   ? ?Date:  01/25/2022  ? ?ID:  Maureen Mitchell, DOB 14-Apr-1927, MRN [id number] ? ?PCP:  Rusty Aus, MD ?  ?[facility] Providers ?Cardiologist:  Janina [facility], MD    ? ?Referring MD: Rusty Aus, MD  ? ?No chief complaint on file. ?Stroke ? ?History of Present Illness:   ? ?Maureen Mitchell is a 88 y.o. female with a hx of GERD, vertigo,  HTN referral for acute CVA follow for evaluation of an event monitor ? ?She presented to the hospital in February with an acute CVA. She had symptoms of vertigo and dizziness. She  underwent MRI  which showed 2 punctate left frontal infarcts.  MRA head and neck mild to moderate intracranial stenosis.  Carotid Doppler left ICA 40 to 59% stenosis and right subclavian artery stenosis. Her dizziness correlated more with vertigo/BPPV and/or orthostasis. She had an event monitor placed. No cardiac disease history. Has not seen a cardiologist.  She denies palpitations. She denies chest pain or SOB. She uses a walker in the house. She walks with a walker.  Non smoker. ? ? ?Cardiology Studies: ?EF 45-50%, mildly reduced. Grade I DD. 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 ? ?Cardiac 30 day monitor - 4 autotriggered events, brief sinus bradycardia, short runs of NSVT. Sinus tachycardia. No atrial fibrillation or flutter ? ?Past Medical History:  ?Diagnosis Date  ? GERD (gastroesophageal reflux disease)   ? Hearing aid worn   ? bilateral  ? Hypertension   ? Restless leg syndrome   ? Vertigo   ? last episode over 5 yrs ago  ? ? ?Past Surgical History:  ?Procedure Laterality Date  ? BROW LIFT Bilateral 07/09/2017  ? Procedure: BLEPHAROPLASTY UPPER EYELID WITH EXCESS SKIN;  Surgeon: Karle Starch, MD;  Location: [facility];  Service: Ophthalmology;  Laterality: Bilateral;  ? BROW PTOSIS Bilateral 07/09/2017  ? Procedure: BROW PTOSIS;  Surgeon: Karle Starch, MD;  Location: [facility];  Service: Ophthalmology;  Laterality: Bilateral;  ?  CHOLECYSTECTOMY    ? EYE SURGERY    ? JOINT REPLACEMENT    ? ? ?Current Medications: ?Current Outpatient Medications on File Prior to Visit  ?Medication Sig Dispense Refill  ? acetaminophen (TYLENOL) 500 MG tablet Take 500 mg by mouth at bedtime.    ? amitriptyline (ELAVIL) 10 MG tablet Take 10 mg by mouth at bedtime.    ? Ascorbic Acid (VITAMIN C PO) Take 1,000 mg by mouth every evening.    ? Cholecalciferol (VITAMIN D-3) 125 MCG (5000 UT) TABS Take 5,000 Units by mouth every evening.    ? clopidogrel (PLAVIX) 75 MG tablet Take 75 mg by mouth daily.    ? Flaxseed, Linseed, (FLAXSEED OIL PO) Take 1 tablet by mouth every evening.    ? hydrochlorothiazide (HYDRODIURIL) 25 MG tablet Take 25 mg by mouth daily.    ? lisinopril (ZESTRIL) 10 MG tablet Take 10 mg by mouth daily.    ? Melatonin 10 MG TABS Take 10 mg by mouth at bedtime.    ? omeprazole (PRILOSEC) 40 MG capsule Take 40 mg by mouth every evening.    ? pramipexole (MIRAPEX) 0.125 MG tablet Take 0.125 mg by mouth at bedtime.    ? pravastatin (PRAVACHOL) 20 MG tablet Take 1 tablet (20 mg total) by mouth daily. 30 tablet 0  ? VITAMIN E PO Take 180 mg by mouth every evening.    ? zolpidem (AMBIEN) 10  MG tablet Take 10 mg by mouth at bedtime.    ? ?No current facility-administered medications on file prior to visit.  ? ? ?Allergies:   Ciprofloxacin, Oxycodone, Sulfa antibiotics, Tramadol, and Trental [pentoxifylline er]  ? ?Social History  ? ?Socioeconomic History  ? Marital status: Widowed  ?  Spouse name: Not on file  ? Number of children: Not on file  ? Years of education: Not on file  ? Highest education level: Not on file  ?Occupational History  ? Not on file  ?Tobacco Use  ? Smoking status: Never  ? Smokeless tobacco: Never  ?Vaping Use  ? Vaping Use: Never used  ?Substance and Sexual Activity  ? Alcohol use: No  ? Drug use: Not on file  ? Sexual activity: Not on file  ?Other Topics Concern  ? Not on file  ?Social History Narrative  ? Not on file  ? ?Social  Determinants of Health  ? ?Financial Resource Strain: Not on file  ?Food Insecurity: Not on file  ?Transportation Needs: Not on file  ?Physical Activity: Not on file  ?Stress: Not on file  ?Social Connections: Not on file  ?  ? ?Family History: ?The patient's father had heart disease. Mother did nor have heart dissase ? ?ROS:   ?Please see the history of present illness.    ? All other systems reviewed and are negative. ? ?EKGs/Labs/Other Studies Reviewed:   ? ?The following studies were reviewed today: ? ? ?EKG:  EKG is  ordered today.  The ekg ordered today demonstrates  ? ?01/25/2022- EKG NSR, poor r wave progression ? ?Recent Labs: ?08/10/2021: ALT 15 ?12/11/2021: BUN 29; Potassium 4.4; Sodium 136 ?12/12/2021: Creatinine, Ser 1.08; Hemoglobin 11.1; Platelets 152  ?Recent Lipid Panel ?   ?Component Value Date/Time  ? CHOL 172 12/12/2021 0755  ? TRIG 110 12/12/2021 0755  ? HDL 38 (L) 12/12/2021 0755  ? CHOLHDL 4.5 12/12/2021 0755  ? VLDL 22 12/12/2021 0755  ? LDLCALC 112 (H) 12/12/2021 0755  ? ? ? ?Risk Assessment/Calculations:   ?  ? ?    ? ?Physical Exam:   ? ?VS:  ? ?Vitals:  ? 01/25/22 1406  ?BP: 124/64  ?Pulse: 74  ?SpO2: 98%  ? ? ? ?Wt Readings from Last 3 Encounters:  ?01/25/22 190 lb 9.6 oz (86.5 kg)  ?07/09/17 225 lb (102.1 kg)  ?08/04/15 225 lb (102.1 kg)  ?  ? ?GEN:  Well nourished, well developed in no acute distress ?HEENT: Normal ?NECK: No JVD; No carotid bruits ?LYMPHATICS: No lymphadenopathy ?CARDIAC: RRR, no murmurs, rubs, gallops ?RESPIRATORY:  Clear to auscultation without rales, wheezing or rhonchi  ?ABDOMEN: Soft, non-tender, non-distended ?MUSCULOSKELETAL:  No edema; No deformity  ?SKIN: Warm and dry ?NEUROLOGIC:  Alert and oriented x 3 ?PSYCHIATRIC:  Normal affect  ? ?ASSESSMENT:   ? ?#CVA: She was managed with DAPT for 3 weeks. She had no atrial fibrillation on 30 day event monitor. Will continue to monitor.  Plan to continue antiplatelet and statin. No significant carotid stenosis.  ?-  continue plavix ( s/p 3 week DAPT ) ?- pravastain 20 mg daily ? 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 Mild MR.  Gradent 4.3 mmHg which is mild at HR 77 bpm. MVA 2.1 cm2. Will plan to follow up and surveillance ? ?HTN- blood pressure is well controlled. Continued HCTz 25 mg daily, lisinopril 10 mg daily.  ? ?PLAN:   ? ?In order of problems listed above: ? ?Follow up in 6  months ? ?   ? ?  ?Medication Adjustments/Labs and Tests Ordered: ?Current medicines are reviewed at length with the patient today.  Concerns regarding medicines are outlined above.  ?No orders of the defined types were placed in this encounter. ? ?No orders of the defined types were placed in this encounter. ? ? ?Patient Instructions  ?Medication Instructions:  ?No Changes In Medications at this time.  ?*If you need a refill on your cardiac medications before your next appointment, please call your pharmacy* ? ?Follow-Up: ?At [facility], you and your health needs are our priority.  As part of our continuing mission to provide you with exceptional heart care, we have created designated Provider Care Teams.  These Care Teams include your primary Cardiologist (physician) and Advanced Practice Providers (APPs -  Physician Assistants and Nurse Practitioners) who all work together to provide you with the care you need, when you need it. ? ?Your next appointment:   ?6 month(s) ? ?The format for your next appointment:   ?In Person ? ?Provider:   ?Janina Mayo, MD   ? ?Other Instructions ?Your heart ultrasound showed possible rheumatic heart disease. The valve is in only mildly affected. Will see you in 6 months to check in. No plans for further testing at this time.  ? ?Signed, ?Janina Mayo, MD  ?01/25/2022 2:31 PM    ?[place] ?

## 2022-01-25 NOTE — Patient Instructions (Addendum)
Medication Instructions:  ?No Changes In Medications at this time.  ?*If you need a refill on your cardiac medications before your next appointment, please call your pharmacy* ? ?Follow-Up: ?At [facility], you and your health needs are our priority.  As part of our continuing mission to provide you with exceptional heart care, we have created designated Provider Care Teams.  These Care Teams include your primary Cardiologist (physician) and Advanced Practice Providers (APPs -  Physician Assistants and Nurse Practitioners) who all work together to provide you with the care you need, when you need it. ? ?Your next appointment:   ?6 month(s) ? ?The format for your next appointment:   ?In Person ? ?Provider:   ?Janina Mayo, MD   ? ?Other Instructions ?Your heart ultrasound showed possible rheumatic heart disease. The valve is in only mildly affected. Will see you in 6 months to check in. No plans for further testing at this time. ?

## 2022-01-31 ENCOUNTER — Other Ambulatory Visit: Payer: Self-pay | Admitting: Student

## 2022-01-31 DIAGNOSIS — I4891 Unspecified atrial fibrillation: Secondary | ICD-10-CM

## 2022-01-31 DIAGNOSIS — R42 Dizziness and giddiness: Secondary | ICD-10-CM

## 2022-01-31 DIAGNOSIS — I639 Cerebral infarction, unspecified: Secondary | ICD-10-CM

## 2022-02-07 ENCOUNTER — Encounter: Payer: Self-pay | Admitting: Adult Health

## 2022-02-07 ENCOUNTER — Ambulatory Visit (INDEPENDENT_AMBULATORY_CARE_PROVIDER_SITE_OTHER): Payer: Medicare Other | Admitting: Adult Health

## 2022-02-07 VITALS — BP 116/67 | HR 79 | Ht 66.0 in | Wt 196.0 lb

## 2022-02-07 DIAGNOSIS — R42 Dizziness and giddiness: Secondary | ICD-10-CM

## 2022-02-07 DIAGNOSIS — I639 Cerebral infarction, unspecified: Secondary | ICD-10-CM

## 2022-02-07 DIAGNOSIS — Z09 Encounter for follow-up examination after completed treatment for conditions other than malignant neoplasm: Secondary | ICD-10-CM | POA: Diagnosis not present

## 2022-02-07 NOTE — Progress Notes (Signed)
?[facility] ?[address] ?[place] [address] ?[phone] ? ?     HOSPITAL FOLLOW UP NOTE ? ?[name] ?Date of Birth:  1927-09-10 ?Medical Record Number:  [id number]  ? ?Reason for Referral:  hospital stroke follow up ? ? ? ?SUBJECTIVE: ? ? ?CHIEF COMPLAINT:  ?Chief Complaint  ?Patient presents with  ? Follow-up  ?  Rm 3 with granddaughter Anderson Malta  ?Pt is well and stable, no concerns. Dizziness has resolved.   ? ? ?HPI:  ? ?[name] is a 88 y.o. female with history of GERD, HTN, restless leg syndrome, vertigo who presented on 12/11/2021 with episodes of vertigo/dizziness for the past 3 to 4 days each lasting about 4 to 5 minutes typically while standing and resolved/improved after sitting.  Personally reviewed hospitalization pertinent progress notes, lab work and imaging.  Evaluated by Dr. Erlinda Hong for 2 punctate right frontal lobe infarcts, etiology cardioembolic vs small vessel disease, likely incidental finding as location not consistent with symptoms.  MRA showed moderate distal left A2 and inferior right M2 stenosis, moderate narrowing of proximal right and distal left P2 and moderate attenuation of distal left PCA.  Carotid Doppler left ICA 40 to 59% stenosis with stenotic right subclavian artery.  EF 45 to 50%.  LDL 112 -added pravastatin 20 mg daily.  A1c 5.4.  On aspirin PTA, recommended DAPT for 3 weeks and Plavix alone.  Recommended 30-day cardiac event monitor to rule out A-fib.  Vertigo/dizziness symptoms possibly BPPV given prior history although dix-hallpike test negative vs related to subclavian artery stenosis in setting of hypotension.  Recommended maintaining adequate hydration, avoiding low BP, sitting and standing slowly, avoid standing for too long and use of TED hose.  Therapies recommended home health therapy. ? ? ?Today, 02/07/2022, patient is being seen for initial hospital follow-up accompanied by her granddaughter.  Overall stable since discharge.   Denies new stroke/TIA symptoms.  No additional dizziness/vertigo events.  She is using a rollator walker, denies any recent falls.  Completed 3 weeks DAPT, remains on Plavix alone as well as pravastatin without side effects.  Blood pressure today 116/67.  She has since been seen by PCP. Completed cardiac monitor which did not show evidence of A-fib.  Has since had cardiology follow-up with plans on follow-up visit in 6 months.  She does mention episode of "feet feeling heavy" after missing her Ambien one night which she has been taking chronically for "years". She does admit to not sleeping well at all that night, she was still able to ambulate, returned to baseline after a couple days. This has not reoccurred since.  No further concerns at this time. ? ? ? ?PERTINENT IMAGING ? ?Per hospitalization 02/07/2022 ?Code Stroke CT head: Hypodensities of basal ganglia and periventricular white matter c/w chronic small vessel ischemic changes. No acute abnormalities. ?MRI  Two punctate foci of diffusion abnormality in R frontal lobe, subacute infarcts. Mild-mod chronic small vessel ischemic disease.  ?MRA  Mod distal L A2 and inferior R M2 stenosis; Mod narrowing of proximal R and distal L P2; Mod attenuation of Distal L PCA. 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 ?LDL 112 ?HgbA1c pending ? ? ? ? ? ? ?ROS:   ?14 system review of systems performed and  negative with exception of those listed in HPI ? ?PMH:  ?Past Medical History:  ?Diagnosis Date  ? GERD (gastroesophageal reflux disease)   ? Hearing aid worn   ? bilateral  ? Hypertension   ? Restless leg syndrome   ? Vertigo   ? last episode over 5 yrs ago  ? ? ?PSH:  ?Past Surgical History:  ?Procedure  Laterality Date  ? BROW LIFT Bilateral 07/09/2017  ? Procedure: BLEPHAROPLASTY UPPER EYELID WITH EXCESS SKIN;  Surgeon: Karle Starch, MD;  Location: [place];  Service: Ophthalmology;  Laterality: Bilateral;  ? BROW PTOSIS Bilateral 07/09/2017  ? Procedure: BROW PTOSIS;  Surgeon: Karle Starch, MD;  Location: [facility];  Service: Ophthalmology;  Laterality: Bilateral;  ? CHOLECYSTECTOMY    ? EYE SURGERY    ? JOINT REPLACEMENT    ? ? ?Social History:  ?Social History  ? ?Socioeconomic History  ? Marital status: Widowed  ?  Spouse name: Not on file  ? Number of children: Not on file  ? Years of education: Not on file  ? Highest education level: Not on file  ?Occupational History  ? Not on file  ?Tobacco Use  ? Smoking status: Never  ? Smokeless tobacco: Never  ?Vaping Use  ? Vaping Use: Never used  ?Substance and Sexual Activity  ? Alcohol use: No  ? Drug use: Not on file  ? Sexual activity: Not on file  ?Other Topics Concern  ? Not on file  ?Social History Narrative  ? Not on file  ? ?Social Determinants of Health  ? ?Financial Resource Strain: Not on file  ?Food Insecurity: Not on file  ?Transportation Needs: Not on file  ?Physical Activity: Not on file  ?Stress: Not on file  ?Social Connections: Not on file  ?Intimate Partner Violence: Not on file  ? ? ?Family History: History reviewed. No pertinent family history. ? ?Medications:   ?Current Outpatient Medications on File Prior to Visit  ?Medication Sig Dispense Refill  ? acetaminophen (TYLENOL) 500 MG tablet Take 500 mg by mouth at bedtime.    ? amitriptyline (ELAVIL) 10 MG tablet Take 10 mg by mouth at bedtime.    ? Ascorbic Acid (VITAMIN C PO) Take 1,000 mg by mouth every evening.    ? Cholecalciferol (VITAMIN D-3) 125 MCG (5000 UT) TABS Take 5,000 Units by mouth every evening.    ? clopidogrel (PLAVIX) 75 MG tablet Take 75 mg by mouth daily.    ? Cyanocobalamin (VITAMIN B-12) 1000 MCG/15ML LIQD Take by mouth.    ? Flaxseed, Linseed,  (FLAXSEED OIL PO) Take 1 tablet by mouth every evening.    ? hydrochlorothiazide (HYDRODIURIL) 25 MG tablet Take 25 mg by mouth daily.    ? lisinopril (ZESTRIL) 10 MG tablet Take 10 mg by mouth daily.    ? Melatonin 10 MG TABS Take 10 mg by mouth at bedtime.    ? omeprazole (PRILOSEC) 40 MG capsule Take 40 mg by mouth every evening.    ? pramipexole (MIRAPEX) 0.125 MG tablet Take 0.125 mg by mouth 2 (two) times daily.    ? pravastatin (PRAVACHOL) 20 MG tablet Take 1 tablet (20 mg total) by mouth daily. 30 tablet 0  ? VITAMIN E PO Take 180 mg by mouth every evening.    ? zolpidem (AMBIEN) 10 MG tablet Take 10 mg by mouth at bedtime.    ? ?No current facility-administered medications on file prior to visit.  ? ? ?Allergies:   ?  Allergies  ?Allergen Reactions  ? Ciprofloxacin Nausea Only  ? Oxycodone Nausea And Vomiting  ? Sulfa Antibiotics Nausea Only  ? Tramadol   ?  Dizziness   ? Trental [Pentoxifylline Er] Nausea Only  ? ? ? ? ?OBJECTIVE: ? ?Physical Exam ? ?Vitals:  ? 02/07/22 1334  ?BP: 116/67  ?Pulse: 79  ?Weight: 196 lb (88.9 kg)  ?Height: '5\' 6"'$  (1.676 m)  ? ?Body mass index is 31.64 kg/m?Marland Kitchen ?No results found. ? ?Poststroke PHQ 2/9 ? ?  02/07/2022  ?  4:55 PM  ?Depression screen PHQ 2/9  ?Decreased Interest 0  ?Down, Depressed, Hopeless 0  ?PHQ - 2 Score 0  ?  ? ?General: well developed, well nourished, very pleasant elderly Caucasian female, seated, in no evident distress ?Head: head normocephalic and atraumatic.   ?Neck: supple with no carotid or supraclavicular bruits ?Cardiovascular: regular rate and rhythm, no murmurs ?Musculoskeletal: no deformity ?Skin:  no rash/petichiae ?Vascular:  Normal pulses all extremities ?  ?Neurologic Exam ?Mental Status: Awake and fully alert.  Fluent speech and language.  Oriented to place and time. Recent and remote memory intact. Attention span, concentration and fund of knowledge appropriate. Mood and affect appropriate.  ?Cranial Nerves: Fundoscopic exam reveals sharp disc  margins. Pupils equal, briskly reactive to light. Extraocular movements full without nystagmus. Visual fields full to confrontation. Hearing intact. Facial sensation intact. Face, tongue, palate moves normally and symmet

## 2022-02-07 NOTE — Patient Instructions (Signed)
Continue clopidogrel 75 mg daily  and pravastatin for secondary stroke prevention ? ?Continue to follow up with PCP regarding cholesterol and blood pressure management  ?Maintain strict control of hypertension with blood pressure goal below 130/90 and cholesterol with LDL cholesterol (bad cholesterol) goal below 70 mg/dL.  ? ?Signs of a Stroke? Follow the BEFAST method:  ?Balance Watch for a sudden loss of balance, trouble with coordination or vertigo ?Eyes Is there a sudden loss of vision in one or both eyes? Or double vision?  ?Face: Ask the person to smile. Does one side of the face droop or is it numb?  ?Arms: Ask the person to raise both arms. Does one arm drift downward? Is there weakness or numbness of a leg? ?Speech: Ask the person to repeat a simple phrase. Does the speech sound slurred/strange? Is the person confused ? ?Time: If you observe any of these signs, call 911. ? ? ? ? ? ? ?Thank you for coming to see [place] at [facility]. I hope we have been able to provide you high quality care today. ? ?You may receive a patient satisfaction survey over the next few weeks. We would appreciate your feedback and comments so that we may continue to improve ourselves and the health of our patients. ? ?

## 2022-03-02 ENCOUNTER — Encounter: Payer: Self-pay | Admitting: Internal Medicine

## 2022-05-21 ENCOUNTER — Other Ambulatory Visit: Payer: Self-pay | Admitting: Internal Medicine

## 2022-05-21 DIAGNOSIS — R221 Localized swelling, mass and lump, neck: Secondary | ICD-10-CM

## 2022-05-28 ENCOUNTER — Other Ambulatory Visit: Payer: PRIVATE HEALTH INSURANCE

## 2022-06-05 ENCOUNTER — Ambulatory Visit
Admission: RE | Admit: 2022-06-05 | Discharge: 2022-06-05 | Disposition: A | Discharge status: Home / Self Care | Payer: Medicare Other | Source: Ambulatory Visit | Attending: Internal Medicine | Admitting: Internal Medicine

## 2022-06-05 DIAGNOSIS — R221 Localized swelling, mass and lump, neck: Secondary | ICD-10-CM

## 2022-06-05 MED ORDER — IOPAMIDOL (ISOVUE-300) INJECTION 61%
50.0000 mL | Freq: Once | INTRAVENOUS | Status: AC | PRN
  Administered 2022-06-05: 50 mL via INTRAVENOUS

## 2022-06-11 ENCOUNTER — Other Ambulatory Visit: Payer: Self-pay | Admitting: General Surgery

## 2022-06-11 DIAGNOSIS — R221 Localized swelling, mass and lump, neck: Secondary | ICD-10-CM

## 2022-06-13 NOTE — Progress Notes (Signed)
Patient on schedule for  Parotid unsedated procedure. Called and spoke with patient's granddaughter on phone with instructions given. Will hold on plavix day of procedure. Stated understanding.

## 2022-06-20 ENCOUNTER — Ambulatory Visit
Admission: RE | Admit: 2022-06-20 | Discharge: 2022-06-20 | Disposition: A | Discharge status: Home / Self Care | Payer: Medicare Other | Source: Ambulatory Visit | Attending: General Surgery | Admitting: General Surgery

## 2022-06-20 DIAGNOSIS — R221 Localized swelling, mass and lump, neck: Secondary | ICD-10-CM | POA: Diagnosis present

## 2022-06-27 ENCOUNTER — Inpatient Hospital Stay: Payer: Medicare Other

## 2022-06-27 ENCOUNTER — Encounter: Payer: Self-pay | Admitting: Oncology

## 2022-06-27 ENCOUNTER — Inpatient Hospital Stay: Payer: Medicare Other | Attending: Oncology | Admitting: Oncology

## 2022-06-27 VITALS — BP 132/79 | HR 66 | Temp 98.5°F | Resp 18 | Wt 190.5 lb

## 2022-06-27 DIAGNOSIS — C07 Malignant neoplasm of parotid gland: Secondary | ICD-10-CM | POA: Diagnosis present

## 2022-06-27 DIAGNOSIS — Z7902 Long term (current) use of antithrombotics/antiplatelets: Secondary | ICD-10-CM | POA: Diagnosis not present

## 2022-06-27 DIAGNOSIS — Z882 Allergy status to sulfonamides status: Secondary | ICD-10-CM | POA: Diagnosis not present

## 2022-06-27 DIAGNOSIS — N189 Chronic kidney disease, unspecified: Secondary | ICD-10-CM | POA: Insufficient documentation

## 2022-06-27 DIAGNOSIS — Z9049 Acquired absence of other specified parts of digestive tract: Secondary | ICD-10-CM | POA: Diagnosis not present

## 2022-06-27 DIAGNOSIS — D539 Nutritional anemia, unspecified: Secondary | ICD-10-CM | POA: Insufficient documentation

## 2022-06-27 DIAGNOSIS — C4442 Squamous cell carcinoma of skin of scalp and neck: Secondary | ICD-10-CM

## 2022-06-27 DIAGNOSIS — Z8261 Family history of arthritis: Secondary | ICD-10-CM | POA: Insufficient documentation

## 2022-06-27 DIAGNOSIS — R221 Localized swelling, mass and lump, neck: Secondary | ICD-10-CM

## 2022-06-27 DIAGNOSIS — Z8673 Personal history of transient ischemic attack (TIA), and cerebral infarction without residual deficits: Secondary | ICD-10-CM | POA: Diagnosis not present

## 2022-06-27 DIAGNOSIS — Z79899 Other long term (current) drug therapy: Secondary | ICD-10-CM | POA: Diagnosis not present

## 2022-06-27 DIAGNOSIS — Z885 Allergy status to narcotic agent status: Secondary | ICD-10-CM | POA: Insufficient documentation

## 2022-06-27 DIAGNOSIS — Z7189 Other specified counseling: Secondary | ICD-10-CM

## 2022-06-27 DIAGNOSIS — N183 Chronic kidney disease, stage 3 unspecified: Secondary | ICD-10-CM | POA: Diagnosis not present

## 2022-06-27 DIAGNOSIS — J90 Pleural effusion, not elsewhere classified: Secondary | ICD-10-CM | POA: Diagnosis not present

## 2022-06-27 DIAGNOSIS — M47812 Spondylosis without myelopathy or radiculopathy, cervical region: Secondary | ICD-10-CM | POA: Insufficient documentation

## 2022-06-27 DIAGNOSIS — Z8249 Family history of ischemic heart disease and other diseases of the circulatory system: Secondary | ICD-10-CM | POA: Insufficient documentation

## 2022-06-27 DIAGNOSIS — Z801 Family history of malignant neoplasm of trachea, bronchus and lung: Secondary | ICD-10-CM | POA: Diagnosis not present

## 2022-06-27 DIAGNOSIS — C7989 Secondary malignant neoplasm of other specified sites: Secondary | ICD-10-CM | POA: Insufficient documentation

## 2022-06-27 DIAGNOSIS — Z881 Allergy status to other antibiotic agents status: Secondary | ICD-10-CM | POA: Diagnosis not present

## 2022-06-27 DIAGNOSIS — I129 Hypertensive chronic kidney disease with stage 1 through stage 4 chronic kidney disease, or unspecified chronic kidney disease: Secondary | ICD-10-CM | POA: Insufficient documentation

## 2022-06-27 DIAGNOSIS — Z888 Allergy status to other drugs, medicaments and biological substances status: Secondary | ICD-10-CM | POA: Insufficient documentation

## 2022-06-27 DIAGNOSIS — D631 Anemia in chronic kidney disease: Secondary | ICD-10-CM | POA: Insufficient documentation

## 2022-06-27 DIAGNOSIS — N1832 Chronic kidney disease, stage 3b: Secondary | ICD-10-CM

## 2022-06-27 DIAGNOSIS — C7800 Secondary malignant neoplasm of unspecified lung: Secondary | ICD-10-CM | POA: Insufficient documentation

## 2022-06-27 LAB — CBC WITH DIFFERENTIAL/PLATELET
Abs Immature Granulocytes: 0.02 10*3/uL (ref 0.00–0.07)
Basophils Absolute: 0 10*3/uL (ref 0.0–0.1)
Basophils Relative: 0 %
Eosinophils Absolute: 0.1 10*3/uL (ref 0.0–0.5)
Eosinophils Relative: 1 %
HCT: 32.3 % — ABNORMAL LOW (ref 36.0–46.0)
Hemoglobin: 10.9 g/dL — ABNORMAL LOW (ref 12.0–15.0)
Immature Granulocytes: 0 %
Lymphocytes Relative: 22 %
Lymphs Abs: 1.1 10*3/uL (ref 0.7–4.0)
MCH: 33.4 pg (ref 26.0–34.0)
MCHC: 33.7 g/dL (ref 30.0–36.0)
MCV: 99.1 fL (ref 80.0–100.0)
Monocytes Absolute: 0.5 10*3/uL (ref 0.1–1.0)
Monocytes Relative: 10 %
Neutro Abs: 3.2 10*3/uL (ref 1.7–7.7)
Neutrophils Relative %: 67 %
Platelets: 165 10*3/uL (ref 150–400)
RBC: 3.26 MIL/uL — ABNORMAL LOW (ref 3.87–5.11)
RDW: 12.7 % (ref 11.5–15.5)
WBC: 4.8 10*3/uL (ref 4.0–10.5)
nRBC: 0 % (ref 0.0–0.2)

## 2022-06-27 LAB — COMPREHENSIVE METABOLIC PANEL
ALT: 18 U/L (ref 0–44)
AST: 24 U/L (ref 15–41)
Albumin: 4 g/dL (ref 3.5–5.0)
Alkaline Phosphatase: 40 U/L (ref 38–126)
Anion gap: 7 (ref 5–15)
BUN: 43 mg/dL — ABNORMAL HIGH (ref 8–23)
CO2: 23 mmol/L (ref 22–32)
Calcium: 9.1 mg/dL (ref 8.9–10.3)
Chloride: 108 mmol/L (ref 98–111)
Creatinine, Ser: 1.52 mg/dL — ABNORMAL HIGH (ref 0.44–1.00)
GFR, Estimated: 32 mL/min — ABNORMAL LOW (ref >60)
Glucose, Bld: 92 mg/dL (ref 70–99)
Potassium: 4.9 mmol/L (ref 3.5–5.1)
Sodium: 138 mmol/L (ref 135–145)
Total Bilirubin: 0.6 mg/dL (ref 0.3–1.2)
Total Protein: 6.7 g/dL (ref 6.5–8.1)

## 2022-06-27 LAB — LACTATE DEHYDROGENASE: LDH: 123 U/L (ref 98–192)

## 2022-06-27 LAB — RETIC PANEL
Immature Retic Fract: 3.9 % (ref 2.3–15.9)
RBC.: 3.3 MIL/uL — ABNORMAL LOW (ref 3.87–5.11)
Retic Count, Absolute: 44.9 10*3/uL (ref 19.0–186.0)
Retic Ct Pct: 1.4 % (ref 0.4–3.1)
Reticulocyte Hemoglobin: 36.1 pg (ref >27.9)

## 2022-06-27 LAB — FOLATE: Folate: >40 ng/mL (ref >5.9)

## 2022-06-27 LAB — IRON AND TIBC
Iron: 149 ug/dL (ref 28–170)
Saturation Ratios: 53 % — ABNORMAL HIGH (ref 10.4–31.8)
TIBC: 280 ug/dL (ref 250–450)
UIBC: 131 ug/dL

## 2022-06-27 LAB — FERRITIN: Ferritin: 297 ng/mL (ref 11–307)

## 2022-06-27 NOTE — Assessment & Plan Note (Signed)
Creatinine stable at baseline.  Avoid NSAIDs.  Encourage hydration.

## 2022-06-27 NOTE — Assessment & Plan Note (Addendum)
Differential diagnosis includes  Metastasis from squamous cell carcinoma of the skin versus from other primary, i.e. head and neck cancer, lung cancer.  Patient is a non-smoker Plan to add p16 staining. Recommend PET scan for evaluation of extent of disease.  Also recommend patient to see ENT for local visualization. She may benefit from radiation +/- immunotherapy.   We will discuss the case on tumor board.

## 2022-06-27 NOTE — Assessment & Plan Note (Signed)
Previously B12 was checked and was above 1500. Check CBC, CMP, LDH, iron, TIBC ferritin, folate

## 2022-06-27 NOTE — Assessment & Plan Note (Signed)
Discussed with patient possible need of radiation+/- immunotherapy.  Patient is open to the options. She is not interested in chemotherapy treatments.

## 2022-06-27 NOTE — Progress Notes (Addendum)
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 head and neck cancer, lung cancer.  Patient is a non-smoker Plan to add p16 staining. Recommend PET scan for evaluation of extent of disease.  Also recommend patient to see ENT for local visualization. She may benefit from radiation +/- immunotherapy.   We will discuss the case on tumor board.  Goals of care, counseling/discussion Discussed with patient possible need of radiation+/- immunotherapy.  Patient is open to the options. She is not interested in chemotherapy treatments.   Macrocytic anemia Previously B12 was checked and was above 1500. Check CBC, CMP, LDH, iron, TIBC ferritin, folate  CKD (chronic kidney disease) stage 3, GFR 30-59 ml/min (HCC) Creatinine stable at baseline.  Avoid NSAIDs.  Encourage hydration.    Orders Placed This Encounter  Procedures   NM PET Image Initial (PI) Skull Base To Thigh    Standing Status:   Future    Standing Expiration Date:   06/27/2023    Order Specific Question:   If indicated for the ordered procedure, I authorize the administration of a radiopharmaceutical per Radiology protocol    Answer:   Yes    Order Specific Question:   Preferred imaging location?    Answer:   [facility]   Ferritin    Standing Status:   Future    Number of Occurrences:   1    Standing Expiration Date:   12/26/2022   Iron and TIBC    Standing Status:   Future    Number of Occurrences:   1    Standing Expiration Date:   06/28/2023   Folate    Standing Status:   Future    Number of Occurrences:   1     Standing Expiration Date:   06/28/2023   Comprehensive metabolic panel    Standing Status:   Future    Number of Occurrences:   1    Standing Expiration Date:   06/28/2023   CBC with Differential/Platelet    Standing Status:   Future    Number of Occurrences:   1    Standing Expiration Date:   06/28/2023   Retic Panel    Standing Status:   Future    Number of Occurrences:   1    Standing Expiration Date:   06/28/2023   Multiple Myeloma Panel (SPEP&IFE w/QIG)    Standing Status:   Future    Number of Occurrences:   1    Standing Expiration Date:   06/28/2023   Kappa/lambda light chains    Standing Status:   Future    Number of Occurrences:   1    Standing Expiration Date:   06/28/2023   Lactate dehydrogenase    Standing Status:   Future    Number of Occurrences:   1    Standing Expiration Date:   06/28/2023   Follow-up to be determined.   All questions were answered. The patient knows to call the clinic with any problems, questions or concerns. No barriers to learning was detected.  Earlie Server, MD 06/27/2022   CHIEF COMPLAINTS/PURPOSE OF CONSULTATION:  Right neck squamous  cell carcinoma  HISTORY OF PRESENTING ILLNESS:  Maureen Mitchell 88 y.o. female presents to establish care for right neck mass, biopsy positive for squamous cell carcinoma I have reviewed her chart and materials related to her cancer extensively and collaborated history with the patient. 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 This has ill-defined margins. Favor pathologic lymph node. Differential includes exophytic parotid mass.Tissue sampling recommended for diagnosis and to rule out metastatic disease. No pharyngeal mass.  No other adenopathy   06/20/2022 Initial Diagnosis   Squamous cell carcinoma of neck  -Patient  noticed right neck knot since April 2023. Ultrasound-guided biopsy of right neck soft tissue pathology showed squamous cell carcinoma, with focal keratinization.  No background residual lymph node tissue was identified in this sample.  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  Margins were negative.  Tumor was 1.5 cm. 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    Reports feeling well today.  Patient reports feeling well.  Denies any unintentional weight loss, night sweats, chills.  No cough, shortness of breath. 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    jan 2023   Vertigo    last episode over 5 yrs ago    SURGICAL HISTORY: Past Surgical History:  Procedure Laterality Date   BROW LIFT Bilateral 07/09/2017   Procedure: BLEPHAROPLASTY UPPER EYELID WITH EXCESS SKIN;  Surgeon: Karle Starch, MD;  Location: [place];  Service: Ophthalmology;  Laterality: Bilateral;   BROW PTOSIS Bilateral 07/09/2017   Procedure: BROW PTOSIS;  Surgeon: Karle Starch, MD;  Location: [place];  Service: Ophthalmology;  Laterality: Bilateral;   CHOLECYSTECTOMY     Dental bridge     EYE SURGERY     JOINT REPLACEMENT     WISDOM TOOTH EXTRACTION      SOCIAL HISTORY: Social History   Socioeconomic History   Marital status: Widowed    Spouse name: Not on file   Number of children: Not on file   Years of education: Not on file   Highest education level: Not on file  Occupational History   Not on file  Tobacco Use   Smoking status: Never   Smokeless tobacco: Never  Vaping Use   Vaping Use: Never used   Substance and Sexual Activity   Alcohol use: No   Drug use: Never   Sexual activity: Never  Other Topics Concern   Not on file  Social History Narrative   Not on file   Social Determinants of Health   Financial Resource Strain: Not on file  Food Insecurity: Not on file  Transportation Needs: Not on file  Physical Activity: Not on file  Stress: Not on file  Social Connections: Not on file  Intimate Partner Violence: Not on file    FAMILY HISTORY: Family History  Problem Relation Age of Onset  Arthritis Mother    Heart Problems Father    Lung cancer Daughter     ALLERGIES:  is allergic to ciprofloxacin, oxycodone, sulfa antibiotics, tramadol, and trental [pentoxifylline er].  MEDICATIONS:  Current Outpatient Medications  Medication Sig Dispense Refill   acetaminophen (TYLENOL) 500 MG tablet Take 500 mg by mouth at bedtime.     amitriptyline (ELAVIL) 10 MG tablet Take 10 mg by mouth at bedtime.     Ascorbic Acid (VITAMIN C PO) Take 1,000 mg by mouth every evening.     Cholecalciferol (VITAMIN D-3) 125 MCG (5000 UT) TABS Take 5,000 Units by mouth every evening.     clopidogrel (PLAVIX) 75 MG tablet Take 75 mg by mouth daily.     CRANBERRY EXTRACT PO Take 1 capsule by mouth daily.     Cyanocobalamin (VITAMIN B-12) 1000 MCG/15ML LIQD Take by mouth.     Flaxseed, Linseed, (FLAXSEED OIL PO) Take 1 tablet by mouth every evening.     hydrochlorothiazide (HYDRODIURIL) 25 MG tablet Take 25 mg by mouth daily.     lisinopril (ZESTRIL) 10 MG tablet Take 10 mg by mouth daily.     Melatonin 10 MG TABS Take 10 mg by mouth at bedtime.     meloxicam (MOBIC) 7.5 MG tablet Take 7.5 mg by mouth daily.     omeprazole (PRILOSEC) 40 MG capsule Take 40 mg by mouth every evening.     pramipexole (MIRAPEX) 0.125 MG tablet Take 0.125 mg by mouth 2 (two) times daily.     pravastatin (PRAVACHOL) 20 MG tablet Take 1 tablet (20 mg total) by mouth daily. 30 tablet 0   VITAMIN E PO Take 180 mg by  mouth every evening.     zolpidem (AMBIEN) 10 MG tablet Take 10 mg by mouth at bedtime.     ferrous sulfate 325 (65 FE) MG tablet Take 325 mg by mouth 2 (two) times daily.     No current facility-administered medications for this visit.    Review of Systems  Constitutional:  Negative for appetite change, chills, fatigue and fever.  HENT:   Negative for hearing loss and voice change.   Eyes:  Negative for eye problems.  Respiratory:  Negative for chest tightness and cough.   Cardiovascular:  Negative for chest pain.  Gastrointestinal:  Negative for abdominal distention, abdominal pain and blood in stool.  Endocrine: Negative for hot flashes.  Genitourinary:  Negative for difficulty urinating and frequency.   Musculoskeletal:  Negative for arthralgias.  Skin:  Negative for itching and rash.       Skin squamous cell carcinoma  Neurological:  Negative for extremity weakness.  Hematological:  Negative for adenopathy.  Psychiatric/Behavioral:  Negative for confusion.      PHYSICAL EXAMINATION: ECOG PERFORMANCE STATUS: 1 - Symptomatic but completely ambulatory  Vitals:   06/27/22 1130  BP: 132/79  Pulse: 66  Resp: 18  Temp: 98.5 F (36.9 C)   Filed Weights   06/27/22 1130  Weight: 190 lb 8 oz (86.4 kg)    Physical Exam Constitutional:      General: She is not in acute distress.    Appearance: She is not diaphoretic.     Comments: Patient walks with a walker  HENT:     Head: Normocephalic and atraumatic.     Nose: Nose normal.     Mouth/Throat:     Pharynx: No oropharyngeal exudate.  Eyes:     General: No scleral icterus. Neck:     Comments:  Right upper neck palpable tissue mass Cardiovascular:     Rate and Rhythm: Normal rate and regular rhythm.     Heart sounds: No murmur heard. Pulmonary:     Effort: Pulmonary effort is normal. No respiratory distress.     Breath sounds: Normal breath sounds. No wheezing.  Abdominal:     General: There is no distension.      Palpations: Abdomen is soft.  Musculoskeletal:        General: Normal range of motion.     Cervical back: Normal range of motion and neck supple.  Skin:    General: Skin is warm and dry.     Findings: No erythema.     Comments: Left neck SCC resection site healing well.  Neurological:     Mental Status: She is alert and oriented to person, place, and time.     Cranial Nerves: No cranial nerve deficit.     Motor: No abnormal muscle tone.  Psychiatric:        Mood and Affect: Mood and affect normal.      LABORATORY DATA:  I have reviewed the data as listed Lab Results  Component Value Date   WBC 4.8 06/27/2022   HGB 10.9 (L) 06/27/2022   HCT 32.3 (L) 06/27/2022   MCV 99.1 06/27/2022   PLT 165 06/27/2022   Recent Labs    08/10/21 1327 12/11/21 1232 12/12/21 0755  NA 139 136  --   K 4.1 4.4  --   CL 110 104  --   CO2 20* 26  --   GLUCOSE 112* 99  --   BUN 23 29*  --   CREATININE 1.28* 1.19* 1.08*  CALCIUM 8.4* 9.4  --   GFRNONAA 40* 43* 48*  PROT 5.8*  --   --   ALBUMIN 3.2*  --   --   AST 21  --   --   ALT 15  --   --   ALKPHOS 37*  --   --   BILITOT 0.7  --   --     RADIOGRAPHIC STUDIES: I have personally reviewed the radiological images as listed and agreed with the findings in the report. [place] CORE BIOPSY (SOFT TISSUE)  Result Date: 06/20/2022 INDICATION: 88 year old female with right neck mass of indeterminate etiology. EXAM: Ultrasound-guided soft tissue biopsy MEDICATIONS: None. ANESTHESIA/SEDATION: None. FLUOROSCOPY TIME:  Fall none. COMPLICATIONS: None immediate. PROCEDURE: Informed written consent was obtained from the patient after a thorough discussion of the procedural risks, benefits and alternatives. All questions were addressed. Maximal Sterile Barrier Technique was utilized including caps, mask, sterile gowns, sterile gloves, sterile drape, hand hygiene and skin antiseptic. A timeout was performed prior to the initiation of the procedure. Preprocedure  ultrasound evaluation of the right neck demonstrated similar appearing homogeneously hypoechoic ovoid subcutaneous mass in the infra parotid region measuring to 2.0 cm. The procedure was planned. The right neck was prepped and draped in standard fashion. Subdermal Local anesthesia was provided with 1% lidocaine at the planned needle entry site. Under direct ultrasound visualization, deeper local anesthetic was provided to the level of the periphery of the mass. A small skin nick was made. Under ultrasound visualization, a 18 gauge Bard mission biopsy device was directed to the periphery of the mass. A total of 3, 18 gauge cores were obtained and placed in formalin. The biopsy device was removed. Hemostasis was achieved with brief manual compression. Postprocedure ultrasound evaluation demonstrated no evidence of surrounding hematoma or other  complicating features. The patient tolerated the procedure well was discharged in good condition. IMPRESSION: Technically successful ultrasound-guided core biopsy of right infra parotid neck mass. Ruthann Cancer, MD [facility] Radiology Electronically Signed   By: Ruthann Cancer M.D.   On: 06/20/2022 14:55   CT SOFT TISSUE NECK W CONTRAST  Result Date: 06/06/2022 CLINICAL DATA:  Right neck mass EXAM: CT NECK WITH CONTRAST TECHNIQUE: Multidetector CT imaging of the neck was performed using the standard protocol following the bolus administration of intravenous contrast. RADIATION DOSE REDUCTION: This exam was performed according to the departmental dose-optimization program which includes automated exposure control, adjustment of the mA and/or kV according to patient size and/or use of iterative reconstruction technique. CONTRAST:  55m ISOVUE-300 IOPAMIDOL (ISOVUE-300) INJECTION 61% COMPARISON:  None Available. FINDINGS: Pharynx and larynx: Normal. No mass or swelling. Salivary glands: Soft tissue mass just below the right  parotid. This shows homogeneous enhancement with mildly ill-defined margins. This is probably extra parotid but could conceivably be an exophytic parotid lesion. No calcification or cystic change. Remainder of the right parotid normal. Left parotid normal. Submandibular glands normal bilaterally. Thyroid: Negative Lymph nodes: Soft tissue mass below the right parotid measures 16 x 13.5 mm. Probable lymph node with ill-defined margins. Differential includes exophytic parotid lesion. No other enlarged lymph nodes in the neck. Vascular: Atherosclerotic calcification in the carotid bifurcation bilaterally. Possible significant stenosis on the left. Follow-up recommended. Normal venous enhancement. Atherosclerotic aortic arch. Limited intracranial: Negative Visualized orbits: Bilateral cataract extraction. Mastoids and visualized paranasal sinuses: Paranasal sinuses clear. Mastoid clear. Skeleton: Cervical spondylosis, moderate in degree. No acute skeletal abnormality. Upper chest: Lung apices clear bilaterally. Azygous lobe fissure on the right. Other: None IMPRESSION: Palpable abnormality corresponds to a solid soft tissue mass just below the right parotid. This has ill-defined margins. Favor pathologic lymph node. Differential includes exophytic parotid mass. Tissue sampling recommended for diagnosis and to rule out metastatic disease. No pharyngeal mass.  No other adenopathy Electronically Signed   By: CFranchot GalloM.D.   On: 06/06/2022 13:34

## 2022-06-28 ENCOUNTER — Encounter: Payer: Self-pay | Admitting: Dermatology

## 2022-06-28 LAB — KAPPA/LAMBDA LIGHT CHAINS
Kappa free light chain: 108.8 mg/L — ABNORMAL HIGH (ref 3.3–19.4)
Kappa, lambda light chain ratio: 3.32 — ABNORMAL HIGH (ref 0.26–1.65)
Lambda free light chains: 32.8 mg/L — ABNORMAL HIGH (ref 5.7–26.3)

## 2022-07-03 ENCOUNTER — Encounter: Payer: Self-pay | Admitting: Oncology

## 2022-07-03 ENCOUNTER — Other Ambulatory Visit: Payer: Self-pay

## 2022-07-03 DIAGNOSIS — C4442 Squamous cell carcinoma of skin of scalp and neck: Secondary | ICD-10-CM

## 2022-07-03 LAB — MULTIPLE MYELOMA PANEL, SERUM
Albumin SerPl Elph-Mcnc: 3.8 g/dL (ref 2.9–4.4)
Albumin/Glob SerPl: 1.6 (ref 0.7–1.7)
Alpha 1: 0.2 g/dL (ref 0.0–0.4)
Alpha2 Glob SerPl Elph-Mcnc: 0.5 g/dL (ref 0.4–1.0)
B-Globulin SerPl Elph-Mcnc: 0.7 g/dL (ref 0.7–1.3)
Gamma Glob SerPl Elph-Mcnc: 1.1 g/dL (ref 0.4–1.8)
Globulin, Total: 2.5 g/dL (ref 2.2–3.9)
IgA: 125 mg/dL (ref 64–422)
IgG (Immunoglobin G), Serum: 1171 mg/dL (ref 586–1602)
IgM (Immunoglobulin M), Srm: 125 mg/dL (ref 26–217)
Total Protein ELP: 6.3 g/dL (ref 6.0–8.5)

## 2022-07-03 NOTE — Telephone Encounter (Signed)
Please schedule MD appt a few days after PET and inform pt of appt.

## 2022-07-04 ENCOUNTER — Telehealth: Payer: Self-pay

## 2022-07-04 NOTE — Telephone Encounter (Signed)
Request to add P16 staining to specimen [phone], has been faxed to [facility] pathology.

## 2022-07-05 LAB — SURGICAL PATHOLOGY

## 2022-07-06 NOTE — Telephone Encounter (Signed)
P-16 staining has been added.

## 2022-07-10 ENCOUNTER — Ambulatory Visit
Admission: RE | Admit: 2022-07-10 | Discharge: 2022-07-10 | Disposition: A | Discharge status: Home / Self Care | Payer: Medicare Other | Source: Ambulatory Visit | Attending: Oncology | Admitting: Oncology

## 2022-07-10 DIAGNOSIS — R911 Solitary pulmonary nodule: Secondary | ICD-10-CM | POA: Diagnosis not present

## 2022-07-10 DIAGNOSIS — C4442 Squamous cell carcinoma of skin of scalp and neck: Secondary | ICD-10-CM | POA: Insufficient documentation

## 2022-07-10 DIAGNOSIS — J90 Pleural effusion, not elsewhere classified: Secondary | ICD-10-CM | POA: Diagnosis not present

## 2022-07-10 LAB — GLUCOSE, CAPILLARY: Glucose-Capillary: 88 mg/dL (ref 70–99)

## 2022-07-10 MED ORDER — FLUDEOXYGLUCOSE F - 18 (FDG) INJECTION
9.8000 | Freq: Once | INTRAVENOUS | Status: AC | PRN
  Administered 2022-07-10: 10.43 via INTRAVENOUS

## 2022-07-12 ENCOUNTER — Inpatient Hospital Stay (HOSPITAL_BASED_OUTPATIENT_CLINIC_OR_DEPARTMENT_OTHER): Payer: Medicare Other | Admitting: Oncology

## 2022-07-12 ENCOUNTER — Encounter: Payer: Self-pay | Admitting: Oncology

## 2022-07-12 VITALS — BP 165/61 | HR 79 | Temp 97.5°F | Resp 18 | Wt 194.0 lb

## 2022-07-12 DIAGNOSIS — N1832 Chronic kidney disease, stage 3b: Secondary | ICD-10-CM | POA: Diagnosis not present

## 2022-07-12 DIAGNOSIS — D539 Nutritional anemia, unspecified: Secondary | ICD-10-CM

## 2022-07-12 DIAGNOSIS — C07 Malignant neoplasm of parotid gland: Secondary | ICD-10-CM | POA: Diagnosis not present

## 2022-07-12 DIAGNOSIS — C4442 Squamous cell carcinoma of skin of scalp and neck: Secondary | ICD-10-CM

## 2022-07-12 DIAGNOSIS — D631 Anemia in chronic kidney disease: Secondary | ICD-10-CM

## 2022-07-12 DIAGNOSIS — N189 Chronic kidney disease, unspecified: Secondary | ICD-10-CM

## 2022-07-12 NOTE — Assessment & Plan Note (Signed)
Anemia is likely due to chronic kidney disease. Multiple myeloma panel is negative for M protein.  Nonspecifically elevated light chain ratio. Recommend 24-hour UPEP.

## 2022-07-12 NOTE — Progress Notes (Signed)
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 Differential diagnosis includes  Primary parotid gland squamous cell carcinoma vs metastasis from unknown primary vs metastasis from head and neck cutaneous squamous cell carcinoma. PET scan was independently reviewed by me and discussed with patient and family. I recommend ENT surgery and radiation oncology evaluation.  Discussed with Dr. Pryor Ochoa. We will also present patient's case on tumor board.   CKD (chronic kidney disease) stage 3, GFR 30-59 ml/min (HCC) Creatinine stable at baseline.  Avoid NSAIDs.  Encourage hydration.  Anemia in chronic kidney disease Anemia is likely due to chronic kidney disease. Multiple myeloma panel is negative for M protein.  Nonspecifically elevated light chain ratio. Recommend 24-hour UPEP.    Orders Placed This Encounter  Procedures   IFE+PROTEIN ELECTRO, 24-HR UR    Standing Status:   Future    Standing Expiration Date:   07/13/2023   Ambulatory referral to Radiation Oncology    Referral Priority:   Routine    Referral Type:   Consultation    Referral Reason:   Specialty Services Required    Requested Specialty:   Radiation Oncology    Number of Visits Requested:   1   Follow-up to be determined.   All questions were answered. The patient knows to call the clinic with any problems, questions or concerns. No barriers to learning was detected.  Earlie Server, MD 07/12/2022   CHIEF COMPLAINTS/PURPOSE OF CONSULTATION:  Right neck squamous cell carcinoma  HISTORY OF PRESENTING ILLNESS:  Maureen Mitchell 88 y.o. female  presents to establish care for right neck mass, biopsy positive for squamous cell carcinoma I have reviewed her chart and materials related to her cancer extensively and collaborated history with the patient. 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 This has ill-defined margins. Favor pathologic lymph node. Differential includes exophytic parotid mass.Tissue sampling recommended for diagnosis and to rule out metastatic disease. No pharyngeal mass.  No other adenopathy   06/20/2022 Initial Diagnosis   Squamous cell carcinoma of neck  -Patient noticed right neck knot since April 2023. Ultrasound-guided biopsy of right neck soft tissue pathology showed squamous cell carcinoma, with focal keratinization.  No background residual lymph node tissue was identified in this sample.  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  Margins were negative.  Tumor was 1.5 cm. 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   07/10/2022 Imaging   PET scan showed   1. Hypermetabolic right superficial parotid lymph node measures 1.4 cm in short axis (stable) with maximum SUV 10.3, compatible with malignancy. 2. Activity in the soft palate is relatively diffuse and probably incidental. 3. Other imaging findings of potential clinical significance: Coronary atherosclerosis. Trace right pleural effusion. 3 by 4 mm subpleural right middle lobe nodule. Mild peripheral atelectasis inferiorly in the left lower lobe. Left peripelvic cyst. Aortic Atherosclerosis (ICD10-I70.0). Substantial  systemic atherosclerosis.    Reports presents to discuss PET scan results.  Denies any unintentional weight loss, night sweats, chills.  No cough, shortness of  breath.  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    jan 2023   Vertigo    last episode over 5 yrs ago    SURGICAL HISTORY: Past Surgical History:  Procedure Laterality Date   BROW LIFT Bilateral 07/09/2017   Procedure: BLEPHAROPLASTY UPPER EYELID WITH EXCESS SKIN;  Surgeon: Karle Starch, MD;  Location: [place];  Service: Ophthalmology;  Laterality: Bilateral;   BROW PTOSIS Bilateral 07/09/2017   Procedure: BROW PTOSIS;  Surgeon: Karle Starch, MD;  Location: [place];  Service: Ophthalmology;  Laterality: Bilateral;   CHOLECYSTECTOMY     Dental bridge     EYE SURGERY     JOINT REPLACEMENT     WISDOM TOOTH EXTRACTION      SOCIAL HISTORY: Social History   Socioeconomic History   Marital status: Widowed    Spouse name: Not on file   Number of children: Not on file   Years of education: Not on file   Highest education level: Not on file  Occupational History   Not on file  Tobacco Use   Smoking status: Never   Smokeless tobacco: Never  Vaping Use   Vaping Use: Never used  Substance and Sexual Activity   Alcohol use: No   Drug use: Never   Sexual activity: Never  Other Topics Concern   Not on file  Social History Narrative   Not on file   Social Determinants of Health   Financial Resource Strain: Not on file  Food Insecurity: Not on file  Transportation Needs: Not on file  Physical Activity: Not on file  Stress: Not on file  Social Connections: Not on file  Intimate Partner Violence: Not on file    FAMILY HISTORY: Family History  Problem Relation Age of Onset   Arthritis Mother    Heart Problems Father    Lung cancer Daughter     ALLERGIES:   is allergic to ciprofloxacin, oxycodone, sulfa antibiotics, tramadol, and trental [pentoxifylline er].  MEDICATIONS:  Current Outpatient Medications  Medication Sig Dispense Refill   acetaminophen (TYLENOL) 500 MG tablet Take 500 mg by mouth at bedtime.     amitriptyline (ELAVIL) 10 MG tablet Take 10 mg by mouth at bedtime.     Ascorbic Acid (VITAMIN C PO) Take 1,000 mg by mouth every evening.     Cholecalciferol (VITAMIN D-3) 125 MCG (5000 UT) TABS Take 5,000 Units by mouth every evening.     clopidogrel (PLAVIX) 75 MG tablet Take 75 mg by mouth daily.     CRANBERRY EXTRACT PO Take 1 capsule by mouth daily.     Cyanocobalamin (VITAMIN B-12) 1000 MCG/15ML LIQD Take by mouth daily.     ferrous sulfate 325 (65 FE) MG tablet Take 325 mg by mouth 2 (two) times a week.     Flaxseed, Linseed, (FLAXSEED OIL PO) Take 1 tablet by mouth every evening.     hydrochlorothiazide (HYDRODIURIL) 25 MG tablet Take  25 mg by mouth daily.     lisinopril (ZESTRIL) 10 MG tablet Take 10 mg by mouth daily.     Melatonin 10 MG TABS Take 10 mg by mouth at bedtime.     meloxicam (MOBIC) 7.5 MG tablet Take 7.5 mg by mouth daily.     omeprazole (PRILOSEC) 40 MG capsule Take 40 mg by mouth every evening.     pramipexole (MIRAPEX) 0.125 MG tablet Take 0.125 mg by mouth 2 (two) times daily.     pravastatin (PRAVACHOL) 20 MG tablet Take 1 tablet (20 mg total) by mouth daily. 30 tablet 0   VITAMIN E PO Take 180 mg by mouth every evening.     zolpidem (AMBIEN) 10 MG tablet Take 10 mg by mouth at bedtime.     No current facility-administered medications for this visit.    Review of Systems  Constitutional:  Negative for appetite change, chills, fatigue and fever.  HENT:   Negative for hearing loss and voice change.   Eyes:  Negative for eye problems.  Respiratory:  Negative for chest tightness and cough.   Cardiovascular:  Negative for chest pain.  Gastrointestinal:  Negative for abdominal distention, abdominal pain and  blood in stool.  Endocrine: Negative for hot flashes.  Genitourinary:  Negative for difficulty urinating and frequency.   Musculoskeletal:  Negative for arthralgias.  Skin:  Negative for itching and rash.       Skin squamous cell carcinoma  Neurological:  Negative for extremity weakness.  Hematological:  Negative for adenopathy.  Psychiatric/Behavioral:  Negative for confusion.      PHYSICAL EXAMINATION: ECOG PERFORMANCE STATUS: 1 - Symptomatic but completely ambulatory  Vitals:   07/12/22 1043  BP: (!) 165/61  Pulse: 79  Resp: 18  Temp: (!) 97.5 F (36.4 C)   Filed Weights   07/12/22 1043  Weight: 194 lb (88 kg)    Physical Exam Constitutional:      General: She is not in acute distress.    Appearance: She is not diaphoretic.     Comments: Patient walks with a walker  HENT:     Head: Normocephalic and atraumatic.     Nose: Nose normal.     Mouth/Throat:     Pharynx: No oropharyngeal exudate.  Eyes:     General: No scleral icterus. Neck:     Comments: Right upper neck palpable tissue mass Cardiovascular:     Rate and Rhythm: Normal rate and regular rhythm.     Heart sounds: No murmur heard. Pulmonary:     Effort: Pulmonary effort is normal. No respiratory distress.     Breath sounds: Normal breath sounds. No wheezing.  Abdominal:     General: There is no distension.     Palpations: Abdomen is soft.  Musculoskeletal:        General: Normal range of motion.     Cervical back: Normal range of motion and neck supple.  Skin:    General: Skin is warm and dry.     Findings: No erythema.     Comments: Left neck SCC resection site healing well.  Neurological:     Mental Status: She is alert and oriented to person, place, and time.     Cranial Nerves: No cranial nerve deficit.     Motor: No abnormal muscle tone.  Psychiatric:        Mood and Affect: Mood and affect normal.      LABORATORY DATA:  I have reviewed the data as  listed    Latest Ref Rng & Units  06/27/2022   12:28 PM 12/12/2021    7:55 AM 12/11/2021   12:32 PM  CBC  WBC 4.0 - 10.5 K/uL 4.8  5.1  6.6   Hemoglobin 12.0 - 15.0 g/dL 10.9  11.1  11.4   Hematocrit 36.0 - 46.0 % 32.3  33.7  35.9   Platelets 150 - 400 K/uL 165  152  196       Latest Ref Rng & Units 06/27/2022   12:28 PM 12/12/2021    7:55 AM 12/11/2021   12:32 PM  CMP  Glucose 70 - 99 mg/dL 92   99   BUN 8 - 23 mg/dL 43   29   Creatinine 0.44 - 1.00 mg/dL 1.52  1.08  1.19   Sodium 135 - 145 mmol/L 138   136   Potassium 3.5 - 5.1 mmol/L 4.9   4.4   Chloride 98 - 111 mmol/L 108   104   CO2 22 - 32 mmol/L 23   26   Calcium 8.9 - 10.3 mg/dL 9.1   9.4   Total Protein 6.5 - 8.1 g/dL 6.7     Total Bilirubin 0.3 - 1.2 mg/dL 0.6     Alkaline Phos 38 - 126 U/L 40     AST 15 - 41 U/L 24     ALT 0 - 44 U/L 18        RADIOGRAPHIC STUDIES: I have personally reviewed the radiological images as listed and agreed with the findings in the report. NM PET Image Initial (PI) Skull Base To Thigh  Result Date: 07/11/2022 CLINICAL DATA:  Initial treatment strategy for squamous cell carcinoma of the right. EXAM: NUCLEAR MEDICINE PET SKULL BASE TO THIGH TECHNIQUE: 10.4 mCi F-18 FDG was injected intravenously. Full-ring PET imaging was performed from the skull base to thigh after the radiotracer. CT data was obtained and used for attenuation correction and anatomic localization. Fasting blood glucose: 88 mg/dl COMPARISON:  CT neck 06/05/2022 FINDINGS: Mediastinal blood pool activity: SUV max 2.2 Liver activity: SUV max NA NECK: Right superficial parotid 1.4 cm in short axis on image 38 series 2 (stable from 06/05/2022) with maximum SUV 10.3, compatible with malignancy. Diffuse activity along the soft palate, maximum SUV 6.3, without a well-defined focal mass. Quite likely incidental. No additional abnormal hypermetabolic activity observed in the neck. Incidental CT findings: Bilateral common carotid atherosclerotic calcification. CHEST: No  significant abnormal hypermetabolic activity in this region. Incidental CT findings: Coronary, aortic arch, and branch vessel atherosclerotic vascular disease. Possible inferior mitral valve calcification. Trace right pleural effusion. Incidental azygous fissure. 3 by 4 mm subpleural nodule in the right middle lobe on image 94 series 2. Mild atelectasis inferiorly in the right middle lobe with some localized bulla adjacent to the atelectasis. Mild peripheral atelectasis in the left lower lobe inferiorly. ABDOMEN/PELVIS: No significant abnormal hypermetabolic activity in this region. Incidental CT findings: Cholecystectomy. Left parapelvic cyst. Right kidney lower pole cyst. The cysts are photopenic and warrant no further imaging follow up. Atherosclerosis is present, including aortoiliac atherosclerotic disease. Substantial dense atheromatous plaque at the origins of the celiac trunk and SMA as well as proximally in the renal arteries. There is some distal atheromatous plaque in the SMA as well. Sigmoid colon diverticulosis. SKELETON: No significant abnormal hypermetabolic activity in this region. Incidental CT findings: None. IMPRESSION: 1. Hypermetabolic right superficial parotid lymph node measures 1.4 cm in short axis (stable) with maximum SUV 10.3, compatible  with malignancy. 2. Activity in the soft palate is relatively diffuse and probably incidental. 3. Other imaging findings of potential clinical significance: Coronary atherosclerosis. Trace right pleural effusion. 3 by 4 mm subpleural right middle lobe nodule. Mild peripheral atelectasis inferiorly in the left lower lobe. Left peripelvic cyst. Aortic Atherosclerosis (ICD10-I70.0). Substantial systemic atherosclerosis. Electronically Signed   By: Van Clines M.D.   On: 07/11/2022 13:24   [place] CORE BIOPSY (SOFT TISSUE)  Result Date: 06/20/2022 INDICATION: 88 year old female with right neck mass of indeterminate etiology. EXAM: Ultrasound-guided soft  tissue biopsy MEDICATIONS: None. ANESTHESIA/SEDATION: None. FLUOROSCOPY TIME:  Fall none. COMPLICATIONS: None immediate. PROCEDURE: Informed written consent was obtained from the patient after a thorough discussion of the procedural risks, benefits and alternatives. All questions were addressed. Maximal Sterile Barrier Technique was utilized including caps, mask, sterile gowns, sterile gloves, sterile drape, hand hygiene and skin antiseptic. A timeout was performed prior to the initiation of the procedure. Preprocedure ultrasound evaluation of the right neck demonstrated similar appearing homogeneously hypoechoic ovoid subcutaneous mass in the infra parotid region measuring to 2.0 cm. The procedure was planned. The right neck was prepped and draped in standard fashion. Subdermal Local anesthesia was provided with 1% lidocaine at the planned needle entry site. Under direct ultrasound visualization, deeper local anesthetic was provided to the level of the periphery of the mass. A small skin nick was made. Under ultrasound visualization, a 18 gauge Bard mission biopsy device was directed to the periphery of the mass. A total of 3, 18 gauge cores were obtained and placed in formalin. The biopsy device was removed. Hemostasis was achieved with brief manual compression. Postprocedure ultrasound evaluation demonstrated no evidence of surrounding hematoma or other complicating features. The patient tolerated the procedure well was discharged in good condition. IMPRESSION: Technically successful ultrasound-guided core biopsy of right infra parotid neck mass. Ruthann Cancer, MD [facility] Radiology Electronically Signed   By: Ruthann Cancer M.D.   On: 06/20/2022 14:55

## 2022-07-12 NOTE — Progress Notes (Signed)
Pt here for PET scan results.

## 2022-07-12 NOTE — Assessment & Plan Note (Addendum)
p16 IHC staining is positive Patient has had ENT evaluation no primary lesion was found.. Differential diagnosis includes  Primary parotid gland squamous cell carcinoma vs metastasis from unknown primary vs metastasis from head and neck cutaneous squamous cell carcinoma. PET scan was independently reviewed by me and discussed with patient and family. I recommend ENT surgery and radiation oncology evaluation.  Discussed with Dr. Pryor Ochoa. We will also present patient's case on tumor board.

## 2022-07-12 NOTE — Assessment & Plan Note (Signed)
Creatinine stable at baseline.  Avoid NSAIDs.  Encourage hydration.

## 2022-07-12 NOTE — Assessment & Plan Note (Deleted)
Anemia is likely due to chronic kidney disease. Multiple myeloma panel is negative for M protein.  Nonspecifically elevated light chain ratio. Recommend 24-hour UPEP.

## 2022-07-13 ENCOUNTER — Inpatient Hospital Stay: Payer: Medicare Other | Admitting: Oncology

## 2022-07-18 ENCOUNTER — Other Ambulatory Visit: Payer: Self-pay

## 2022-07-18 DIAGNOSIS — C4442 Squamous cell carcinoma of skin of scalp and neck: Secondary | ICD-10-CM

## 2022-07-18 DIAGNOSIS — C07 Malignant neoplasm of parotid gland: Secondary | ICD-10-CM | POA: Diagnosis not present

## 2022-07-19 ENCOUNTER — Ambulatory Visit
Admission: RE | Admit: 2022-07-19 | Discharge: 2022-07-19 | Disposition: A | Discharge status: Home / Self Care | Payer: Medicare Other | Source: Ambulatory Visit | Attending: Radiation Oncology | Admitting: Radiation Oncology

## 2022-07-19 ENCOUNTER — Inpatient Hospital Stay: Payer: Medicare Other

## 2022-07-19 VITALS — BP 150/63 | HR 84 | Temp 96.0°F | Resp 16 | Ht 66.0 in | Wt 192.8 lb

## 2022-07-19 DIAGNOSIS — Z791 Long term (current) use of non-steroidal anti-inflammatories (NSAID): Secondary | ICD-10-CM | POA: Diagnosis not present

## 2022-07-19 DIAGNOSIS — I10 Essential (primary) hypertension: Secondary | ICD-10-CM | POA: Diagnosis not present

## 2022-07-19 DIAGNOSIS — Z7902 Long term (current) use of antithrombotics/antiplatelets: Secondary | ICD-10-CM | POA: Diagnosis not present

## 2022-07-19 DIAGNOSIS — C07 Malignant neoplasm of parotid gland: Secondary | ICD-10-CM | POA: Insufficient documentation

## 2022-07-19 DIAGNOSIS — Z85828 Personal history of other malignant neoplasm of skin: Secondary | ICD-10-CM | POA: Insufficient documentation

## 2022-07-19 DIAGNOSIS — K219 Gastro-esophageal reflux disease without esophagitis: Secondary | ICD-10-CM | POA: Insufficient documentation

## 2022-07-19 DIAGNOSIS — Z79899 Other long term (current) drug therapy: Secondary | ICD-10-CM | POA: Diagnosis not present

## 2022-07-19 NOTE — Consult Note (Signed)
NEW PATIENT EVALUATION  Name: Maureen Mitchell  MRN: [id number]  Date:   07/19/2022     DOB: Mar 24, 1927   This 88 y.o. female patient presents to the clinic for initial evaluation of squamous cell carcinoma of the right neck possibly a parotid origin p16 positive.  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   PREVIOUS INVESTIGATIONS:  PET CT scan reviewed Pathology report reviewed Clinical notes reviewed  HPI: Patient is a 88 year old female with history of squamous cell carcinoma of the skin of the head and neck region.  She developed a right submet digastric nodule evaluated by ENT.  CT scan showed a palpable abnormality corresponding with solid soft tissue mass just below the right parotid.  Ill-defined margins favoring a pathologic lymph node.  She underwent ultrasound-guided biopsy of the node which was positive for squamous cell carcinoma with focal keratinization p16 positive.  No background residual lymph node tissue is submitted in the sampling of that biopsy.  PET CT scan showed only hypermetabolic activity in that node.  Node measured 1.4 cm.  There is also activity in the soft palate relatively diffuse probably incidental.  She is seen today by radiation collagen for opinion.  She also has opinions for second evaluation at [facility] head and neck surgery.  Patient is asymptomatic. 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    PAST SURGICAL HISTORY:  Past Surgical History:  Procedure Laterality  Date   BROW LIFT Bilateral 07/09/2017   Procedure: BLEPHAROPLASTY UPPER EYELID WITH EXCESS SKIN;  Surgeon: Karle Starch, MD;  Location: [place];  Service: Ophthalmology;  Laterality: Bilateral;   BROW PTOSIS Bilateral 07/09/2017   Procedure: BROW PTOSIS;  Surgeon: Karle Starch, MD;  Location: [place];  Service: Ophthalmology;  Laterality: Bilateral;   CHOLECYSTECTOMY     Dental bridge     EYE SURGERY     JOINT REPLACEMENT     WISDOM TOOTH EXTRACTION      FAMILY HISTORY: family history includes Arthritis in her mother; Heart Problems in her father; Lung cancer in her daughter.  SOCIAL HISTORY:  reports that she has never smoked. She has never used smokeless tobacco. She reports that she does not drink alcohol and does not use drugs.  ALLERGIES: Ciprofloxacin, Oxycodone, Sulfa antibiotics, Tramadol, and Trental [pentoxifylline er]  MEDICATIONS:  Current Outpatient Medications  Medication Sig Dispense Refill   acetaminophen (TYLENOL) 500 MG tablet Take 500 mg by mouth at bedtime.     amitriptyline (ELAVIL) 10 MG tablet Take 10 mg by mouth at bedtime.     Ascorbic Acid (VITAMIN C PO) Take 1,000 mg by mouth every evening.     Cholecalciferol (VITAMIN D-3) 125 MCG (5000 UT) TABS Take 5,000 Units by mouth every evening.     clopidogrel (PLAVIX) 75 MG tablet Take 75 mg by mouth daily.     CRANBERRY EXTRACT PO Take 1 capsule by mouth daily.     Cyanocobalamin (VITAMIN B-12) 1000 MCG/15ML LIQD Take by mouth daily.     ferrous sulfate 325 (  65 FE) MG tablet Take 325 mg by mouth 2 (two) times a week.     Flaxseed, Linseed, (FLAXSEED OIL PO) Take 1 tablet by mouth every evening.     hydrochlorothiazide (HYDRODIURIL) 25 MG tablet Take 25 mg by mouth daily.     lisinopril (ZESTRIL) 10 MG tablet Take 10 mg by mouth daily.     Melatonin 10 MG TABS Take 10 mg by mouth at bedtime.     meloxicam (MOBIC) 7.5 MG tablet Take 7.5 mg by mouth daily.     omeprazole (PRILOSEC) 40 MG  capsule Take 40 mg by mouth every evening.     pramipexole (MIRAPEX) 0.125 MG tablet Take 0.125 mg by mouth 2 (two) times daily.     pravastatin (PRAVACHOL) 20 MG tablet Take 1 tablet (20 mg total) by mouth daily. 30 tablet 0   VITAMIN E PO Take 180 mg by mouth every evening.     zolpidem (AMBIEN) 10 MG tablet Take 10 mg by mouth at bedtime.     No current facility-administered medications for this encounter.    ECOG PERFORMANCE STATUS:  0 - Asymptomatic  REVIEW OF SYSTEMS: Patient denies any weight loss, fatigue, weakness, fever, chills or night sweats. Patient denies any loss of vision, blurred vision. Patient denies any ringing  of the ears or hearing loss. No irregular heartbeat. Patient denies heart murmur or history of fainting. Patient denies any chest pain or pain radiating to her upper extremities. Patient denies any shortness of breath, difficulty breathing at night, cough or hemoptysis. Patient denies any swelling in the lower legs. Patient denies any nausea vomiting, vomiting of blood, or coffee ground material in the vomitus. Patient denies any stomach pain. Patient states has had normal bowel movements no significant constipation or diarrhea. Patient denies any dysuria, hematuria or significant nocturia. Patient denies any problems walking, swelling in the joints or loss of balance. Patient denies any skin changes, loss of hair or loss of weight. Patient denies any excessive worrying or anxiety or significant depression. Patient denies any problems with insomnia. Patient denies excessive thirst, polyuria, polydipsia. Patient denies any swollen glands, patient denies easy bruising or easy bleeding. Patient denies any recent infections, allergies or URI. Patient "s visual fields have not changed significantly in recent time.   PHYSICAL EXAM: BP (!) 150/63   Pulse 84   Temp (!) 96 F (35.6 C)   Resp 16   Ht '5\' 6"'$  (1.676 m)   Wt 192 lb 12.8 oz (87.5 kg)   BMI 31.12 kg/m  Firm nodule  approximately 2 cm in the right subdigastric region.  No other evidence of cervical or supraclavicular adenopathy is identified.  Well-developed well-nourished patient in NAD. HEENT reveals PERLA, EOMI, discs not visualized.  Oral cavity is clear. No oral mucosal lesions are identified. Neck is clear without evidence of cervical or supraclavicular adenopathy. Lungs are clear to A&P. Cardiac examination is essentially unremarkable with regular rate and rhythm without murmur rub or thrill. Abdomen is benign with no organomegaly or masses noted. Motor sensory and DTR levels are equal and symmetric in the upper and lower extremities. Cranial nerves II through XII are grossly intact. Proprioception is intact. No peripheral adenopathy or edema is identified. No motor or sensory levels are noted. Crude visual fields are within normal range.  LABORATORY DATA: Pathology reports reviewed    RADIOLOGY RESULTS: PET CT and CT scans reviewed compatible with above-stated findings   IMPRESSION: Possible lymph node involvement of p16 positive  squamous cell carcinoma in 88 year old female with history of prior skin cancers of the head and neck region.  PLAN: At this time option for treatment would be to treat with external beam radiation therapy to this node and regional nodes.  Would plan on delivering 60 Gray to the primary tumor as well as approximately 19 Gray to surrounding lymph node regions.  This will be a low side effect profile since he would be a limited area of radiation therapy in this 88 year old female.  Patient certainly could undergo resection and is being evaluated by head and neck surgeon.  Patient will discuss this with ENT tomorrow make a further decision about whether to go to [facility] for surgical consultation.  Risk and benefits of treatment occluding possible sore throat skin reaction fatigue all were discussed in detail with the patient and her granddaughter they both seem to comprehend my treatment  plan well.  They will alert [place] should they decide on radiation therapy.  I would like to take this opportunity to thank you for allowing me to participate in the care of your patient.Noreene Filbert, MD

## 2022-07-19 NOTE — Progress Notes (Signed)
Tumor Board Documentation  Tianne Plott was presented by Dr Tasia Catchings at our Tumor Board on 07/19/2022, which included representatives from radiology, medical oncology, surgical, pharmacy, pulmonology, pathology, navigation, research, internal medicine, radiation oncology, palliative care.  Tanayah currently presents as a new patient, for [place], for new positive pathology with history of the following treatments: surgical intervention(s).  Additionally, we reviewed previous medical and familial history, history of present illness, and recent lab results along with all available histopathologic and imaging studies. The tumor board considered available treatment options and made the following recommendations: Radiation therapy (primary modality), Surgery (Will see ENT for surgical evaluation)    The following procedures/referrals were also placed: No orders of the defined types were placed in this encounter.   Clinical Trial Status: not discussed   Staging used: To be determined AJCC Staging:       Group: Squamous Cell Neck Cancer, Metastatic   National site-specific guidelines   were discussed with respect to the case.  Tumor board is a meeting of clinicians from various specialty areas who evaluate and discuss patients for whom a multidisciplinary approach is being considered. Final determinations in the plan of care are those of the provider(s). The responsibility for follow up of recommendations given during tumor board is that of the provider.   Today's extended care, comprehensive team conference, Sondra was not present for the discussion and was not examined.   Multidisciplinary Tumor Board is a multidisciplinary case peer review process.  Decisions discussed in the Multidisciplinary Tumor Board reflect the opinions of the specialists present at the conference without having examined the patient.  Ultimately, treatment and diagnostic decisions rest with the primary provider(s) and the  patient.

## 2022-07-20 LAB — IFE+PROTEIN ELECTRO, 24-HR UR
% BETA, Urine: 40.8 %
ALPHA 1 URINE: 5.9 %
Albumin, U: 26.1 %
Alpha 2, Urine: 10.7 %
GAMMA GLOBULIN URINE: 16.5 %
Total Protein, Urine-Ur/day: 80 mg/24 hr (ref 30–150)
Total Protein, Urine: 5.7 mg/dL
Total Volume: 1400

## 2022-07-23 ENCOUNTER — Institutional Professional Consult (permissible substitution): Payer: PRIVATE HEALTH INSURANCE | Admitting: Radiation Oncology

## 2022-07-31 ENCOUNTER — Ambulatory Visit: Payer: Medicare Other

## 2022-08-08 ENCOUNTER — Ambulatory Visit
Admission: RE | Admit: 2022-08-08 | Discharge: 2022-08-08 | Disposition: A | Discharge status: Home / Self Care | Payer: Medicare Other | Source: Ambulatory Visit | Attending: Radiation Oncology | Admitting: Radiation Oncology

## 2022-08-08 DIAGNOSIS — Z791 Long term (current) use of non-steroidal anti-inflammatories (NSAID): Secondary | ICD-10-CM | POA: Insufficient documentation

## 2022-08-08 DIAGNOSIS — I10 Essential (primary) hypertension: Secondary | ICD-10-CM | POA: Diagnosis not present

## 2022-08-08 DIAGNOSIS — K219 Gastro-esophageal reflux disease without esophagitis: Secondary | ICD-10-CM | POA: Insufficient documentation

## 2022-08-08 DIAGNOSIS — Z7902 Long term (current) use of antithrombotics/antiplatelets: Secondary | ICD-10-CM | POA: Insufficient documentation

## 2022-08-08 DIAGNOSIS — Z85828 Personal history of other malignant neoplasm of skin: Secondary | ICD-10-CM | POA: Insufficient documentation

## 2022-08-08 DIAGNOSIS — Z79899 Other long term (current) drug therapy: Secondary | ICD-10-CM | POA: Insufficient documentation

## 2022-08-08 DIAGNOSIS — C07 Malignant neoplasm of parotid gland: Secondary | ICD-10-CM | POA: Diagnosis present

## 2022-08-10 ENCOUNTER — Other Ambulatory Visit: Payer: Self-pay | Admitting: *Deleted

## 2022-08-10 DIAGNOSIS — C07 Malignant neoplasm of parotid gland: Secondary | ICD-10-CM

## 2022-08-14 DIAGNOSIS — C07 Malignant neoplasm of parotid gland: Secondary | ICD-10-CM | POA: Diagnosis not present

## 2022-08-16 ENCOUNTER — Ambulatory Visit
Admission: RE | Admit: 2022-08-16 | Discharge: 2022-08-16 | Disposition: A | Discharge status: Home / Self Care | Payer: Medicare Other | Source: Ambulatory Visit | Attending: Radiation Oncology | Admitting: Radiation Oncology

## 2022-08-20 ENCOUNTER — Other Ambulatory Visit: Payer: Self-pay

## 2022-08-20 ENCOUNTER — Ambulatory Visit
Admission: RE | Admit: 2022-08-20 | Discharge: 2022-08-20 | Disposition: A | Discharge status: Home / Self Care | Payer: Medicare Other | Source: Ambulatory Visit | Attending: Radiation Oncology | Admitting: Radiation Oncology

## 2022-08-20 DIAGNOSIS — C07 Malignant neoplasm of parotid gland: Secondary | ICD-10-CM | POA: Diagnosis not present

## 2022-08-20 LAB — RAD ONC ARIA SESSION SUMMARY
Course Elapsed Days: 0
Plan Fractions Treated to Date: 1
Plan Prescribed Dose Per Fraction: 2 Gy
Plan Total Fractions Prescribed: 30
Plan Total Prescribed Dose: 60 Gy
Reference Point Dosage Given to Date: 2 Gy
Reference Point Session Dosage Given: 2 Gy
Session Number: 1

## 2022-08-21 ENCOUNTER — Other Ambulatory Visit: Payer: Self-pay

## 2022-08-21 ENCOUNTER — Ambulatory Visit
Admission: RE | Admit: 2022-08-21 | Discharge: 2022-08-21 | Disposition: A | Discharge status: Home / Self Care | Payer: Medicare Other | Source: Ambulatory Visit | Attending: Radiation Oncology | Admitting: Radiation Oncology

## 2022-08-21 DIAGNOSIS — C07 Malignant neoplasm of parotid gland: Secondary | ICD-10-CM | POA: Diagnosis not present

## 2022-08-21 LAB — RAD ONC ARIA SESSION SUMMARY
Course Elapsed Days: 1
Plan Fractions Treated to Date: 2
Plan Prescribed Dose Per Fraction: 2 Gy
Plan Total Fractions Prescribed: 30
Plan Total Prescribed Dose: 60 Gy
Reference Point Dosage Given to Date: 4 Gy
Reference Point Session Dosage Given: 2 Gy
Session Number: 2

## 2022-08-22 ENCOUNTER — Encounter: Payer: Self-pay | Admitting: Internal Medicine

## 2022-08-22 ENCOUNTER — Other Ambulatory Visit: Payer: Self-pay

## 2022-08-22 ENCOUNTER — Ambulatory Visit: Payer: Medicare Other | Attending: Internal Medicine | Admitting: Internal Medicine

## 2022-08-22 ENCOUNTER — Ambulatory Visit
Admission: RE | Admit: 2022-08-22 | Discharge: 2022-08-22 | Disposition: A | Discharge status: Home / Self Care | Payer: Medicare Other | Source: Ambulatory Visit | Attending: Radiation Oncology | Admitting: Radiation Oncology

## 2022-08-22 VITALS — BP 132/62 | HR 68 | Ht 66.0 in | Wt 193.6 lb

## 2022-08-22 DIAGNOSIS — Z85828 Personal history of other malignant neoplasm of skin: Secondary | ICD-10-CM | POA: Insufficient documentation

## 2022-08-22 DIAGNOSIS — I10 Essential (primary) hypertension: Secondary | ICD-10-CM | POA: Diagnosis present

## 2022-08-22 DIAGNOSIS — Z79899 Other long term (current) drug therapy: Secondary | ICD-10-CM | POA: Diagnosis not present

## 2022-08-22 DIAGNOSIS — K219 Gastro-esophageal reflux disease without esophagitis: Secondary | ICD-10-CM | POA: Diagnosis not present

## 2022-08-22 DIAGNOSIS — Z791 Long term (current) use of non-steroidal anti-inflammatories (NSAID): Secondary | ICD-10-CM | POA: Insufficient documentation

## 2022-08-22 DIAGNOSIS — C07 Malignant neoplasm of parotid gland: Secondary | ICD-10-CM | POA: Insufficient documentation

## 2022-08-22 DIAGNOSIS — Z7902 Long term (current) use of antithrombotics/antiplatelets: Secondary | ICD-10-CM | POA: Diagnosis not present

## 2022-08-22 LAB — RAD ONC ARIA SESSION SUMMARY
Course Elapsed Days: 2
Plan Fractions Treated to Date: 3
Plan Prescribed Dose Per Fraction: 2 Gy
Plan Total Fractions Prescribed: 30
Plan Total Prescribed Dose: 60 Gy
Reference Point Dosage Given to Date: 6 Gy
Reference Point Session Dosage Given: 2 Gy
Session Number: 3

## 2022-08-22 NOTE — Patient Instructions (Signed)
Medication Instructions:  No Changes In Medications at this time.  *If you need a refill on your cardiac medications before your next appointment, please call your pharmacy*  Lab Work: None Ordered At This Time.  If you have labs (blood work) drawn today and your tests are completely normal, you will receive your results only by: [place] (if you have MyChart) OR A paper copy in the mail If you have any lab test that is abnormal or we need to change your treatment, we will call you to review the results.  Testing/Procedures: None Ordered At This Time.   Follow-Up: At [facility], you and your health needs are our priority.  As part of our continuing mission to provide you with exceptional heart care, we have created designated Provider Care Teams.  These Care Teams include your primary Cardiologist (physician) and Advanced Practice Providers (APPs -  Physician Assistants and Nurse Practitioners) who all work together to provide you with the care you need, when you need it.  Your next appointment:   AS NEEDED   The format for your next appointment:   In Person  Provider:   Janina Mayo, MD     Other Instructions  [facility] WAS THE HOSPITAL DISCUSSED WITH Dr. Vevay
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
## 2022-08-23 ENCOUNTER — Ambulatory Visit
Admission: RE | Admit: 2022-08-23 | Discharge: 2022-08-23 | Disposition: A | Discharge status: Home / Self Care | Payer: Medicare Other | Source: Ambulatory Visit | Attending: Radiation Oncology | Admitting: Radiation Oncology

## 2022-08-23 ENCOUNTER — Other Ambulatory Visit: Payer: Self-pay

## 2022-08-23 ENCOUNTER — Inpatient Hospital Stay: Payer: Medicare Other

## 2022-08-23 DIAGNOSIS — C7800 Secondary malignant neoplasm of unspecified lung: Secondary | ICD-10-CM | POA: Insufficient documentation

## 2022-08-23 DIAGNOSIS — Z79899 Other long term (current) drug therapy: Secondary | ICD-10-CM | POA: Insufficient documentation

## 2022-08-23 DIAGNOSIS — C07 Malignant neoplasm of parotid gland: Secondary | ICD-10-CM | POA: Insufficient documentation

## 2022-08-23 LAB — RAD ONC ARIA SESSION SUMMARY
Course Elapsed Days: 3
Plan Fractions Treated to Date: 4
Plan Prescribed Dose Per Fraction: 2 Gy
Plan Total Fractions Prescribed: 30
Plan Total Prescribed Dose: 60 Gy
Reference Point Dosage Given to Date: 8 Gy
Reference Point Session Dosage Given: 2 Gy
Session Number: 4

## 2022-08-23 LAB — CBC
HCT: 31.1 % — ABNORMAL LOW (ref 36.0–46.0)
Hemoglobin: 10.2 g/dL — ABNORMAL LOW (ref 12.0–15.0)
MCH: 33.4 pg (ref 26.0–34.0)
MCHC: 32.8 g/dL (ref 30.0–36.0)
MCV: 102 fL — ABNORMAL HIGH (ref 80.0–100.0)
Platelets: 155 10*3/uL (ref 150–400)
RBC: 3.05 MIL/uL — ABNORMAL LOW (ref 3.87–5.11)
RDW: 12.7 % (ref 11.5–15.5)
WBC: 6.3 10*3/uL (ref 4.0–10.5)
nRBC: 0 % (ref 0.0–0.2)

## 2022-08-24 ENCOUNTER — Other Ambulatory Visit: Payer: Self-pay

## 2022-08-24 ENCOUNTER — Ambulatory Visit
Admission: RE | Admit: 2022-08-24 | Discharge: 2022-08-24 | Disposition: A | Discharge status: Home / Self Care | Payer: Medicare Other | Source: Ambulatory Visit | Attending: Radiation Oncology | Admitting: Radiation Oncology

## 2022-08-24 DIAGNOSIS — C07 Malignant neoplasm of parotid gland: Secondary | ICD-10-CM | POA: Diagnosis not present

## 2022-08-24 LAB — RAD ONC ARIA SESSION SUMMARY
Course Elapsed Days: 4
Plan Fractions Treated to Date: 5
Plan Prescribed Dose Per Fraction: 2 Gy
Plan Total Fractions Prescribed: 30
Plan Total Prescribed Dose: 60 Gy
Reference Point Dosage Given to Date: 10 Gy
Reference Point Session Dosage Given: 2 Gy
Session Number: 5

## 2022-08-27 ENCOUNTER — Other Ambulatory Visit: Payer: Self-pay

## 2022-08-27 ENCOUNTER — Ambulatory Visit
Admission: RE | Admit: 2022-08-27 | Discharge: 2022-08-27 | Disposition: A | Discharge status: Home / Self Care | Payer: Medicare Other | Source: Ambulatory Visit | Attending: Radiation Oncology | Admitting: Radiation Oncology

## 2022-08-27 DIAGNOSIS — C07 Malignant neoplasm of parotid gland: Secondary | ICD-10-CM | POA: Diagnosis not present

## 2022-08-27 LAB — RAD ONC ARIA SESSION SUMMARY
Course Elapsed Days: 7
Plan Fractions Treated to Date: 6
Plan Prescribed Dose Per Fraction: 2 Gy
Plan Total Fractions Prescribed: 30
Plan Total Prescribed Dose: 60 Gy
Reference Point Dosage Given to Date: 12 Gy
Reference Point Session Dosage Given: 2 Gy
Session Number: 6

## 2022-08-28 ENCOUNTER — Other Ambulatory Visit: Payer: Self-pay

## 2022-08-28 ENCOUNTER — Ambulatory Visit
Admission: RE | Admit: 2022-08-28 | Discharge: 2022-08-28 | Disposition: A | Discharge status: Home / Self Care | Payer: Medicare Other | Source: Ambulatory Visit | Attending: Radiation Oncology | Admitting: Radiation Oncology

## 2022-08-28 DIAGNOSIS — C07 Malignant neoplasm of parotid gland: Secondary | ICD-10-CM | POA: Diagnosis not present

## 2022-08-28 LAB — RAD ONC ARIA SESSION SUMMARY
Course Elapsed Days: 8
Plan Fractions Treated to Date: 7
Plan Prescribed Dose Per Fraction: 2 Gy
Plan Total Fractions Prescribed: 30
Plan Total Prescribed Dose: 60 Gy
Reference Point Dosage Given to Date: 14 Gy
Reference Point Session Dosage Given: 2 Gy
Session Number: 7

## 2022-08-29 ENCOUNTER — Other Ambulatory Visit: Payer: Self-pay

## 2022-08-29 ENCOUNTER — Ambulatory Visit
Admission: RE | Admit: 2022-08-29 | Discharge: 2022-08-29 | Disposition: A | Discharge status: Home / Self Care | Payer: Medicare Other | Source: Ambulatory Visit | Attending: Radiation Oncology | Admitting: Radiation Oncology

## 2022-08-29 DIAGNOSIS — C07 Malignant neoplasm of parotid gland: Secondary | ICD-10-CM | POA: Diagnosis not present

## 2022-08-29 LAB — RAD ONC ARIA SESSION SUMMARY
Course Elapsed Days: 9
Plan Fractions Treated to Date: 8
Plan Prescribed Dose Per Fraction: 2 Gy
Plan Total Fractions Prescribed: 30
Plan Total Prescribed Dose: 60 Gy
Reference Point Dosage Given to Date: 16 Gy
Reference Point Session Dosage Given: 2 Gy
Session Number: 8

## 2022-08-30 ENCOUNTER — Inpatient Hospital Stay: Payer: Medicare Other

## 2022-08-30 ENCOUNTER — Ambulatory Visit
Admission: RE | Admit: 2022-08-30 | Discharge: 2022-08-30 | Disposition: A | Discharge status: Home / Self Care | Payer: Medicare Other | Source: Ambulatory Visit | Attending: Radiation Oncology | Admitting: Radiation Oncology

## 2022-08-30 ENCOUNTER — Other Ambulatory Visit: Payer: Self-pay

## 2022-08-30 DIAGNOSIS — C07 Malignant neoplasm of parotid gland: Secondary | ICD-10-CM | POA: Diagnosis not present

## 2022-08-30 LAB — RAD ONC ARIA SESSION SUMMARY
Course Elapsed Days: 10
Plan Fractions Treated to Date: 9
Plan Prescribed Dose Per Fraction: 2 Gy
Plan Total Fractions Prescribed: 30
Plan Total Prescribed Dose: 60 Gy
Reference Point Dosage Given to Date: 18 Gy
Reference Point Session Dosage Given: 2 Gy
Session Number: 9

## 2022-08-30 LAB — CBC
HCT: 31.1 % — ABNORMAL LOW (ref 36.0–46.0)
Hemoglobin: 10 g/dL — ABNORMAL LOW (ref 12.0–15.0)
MCH: 32.8 pg (ref 26.0–34.0)
MCHC: 32.2 g/dL (ref 30.0–36.0)
MCV: 102 fL — ABNORMAL HIGH (ref 80.0–100.0)
Platelets: 151 10*3/uL (ref 150–400)
RBC: 3.05 MIL/uL — ABNORMAL LOW (ref 3.87–5.11)
RDW: 12.7 % (ref 11.5–15.5)
WBC: 5.8 10*3/uL (ref 4.0–10.5)
nRBC: 0 % (ref 0.0–0.2)

## 2022-08-31 ENCOUNTER — Ambulatory Visit
Admission: RE | Admit: 2022-08-31 | Discharge: 2022-08-31 | Disposition: A | Discharge status: Home / Self Care | Payer: Medicare Other | Source: Ambulatory Visit | Attending: Radiation Oncology | Admitting: Radiation Oncology

## 2022-08-31 ENCOUNTER — Other Ambulatory Visit: Payer: Self-pay

## 2022-08-31 DIAGNOSIS — C07 Malignant neoplasm of parotid gland: Secondary | ICD-10-CM | POA: Diagnosis not present

## 2022-08-31 LAB — RAD ONC ARIA SESSION SUMMARY
Course Elapsed Days: 11
Plan Fractions Treated to Date: 10
Plan Prescribed Dose Per Fraction: 2 Gy
Plan Total Fractions Prescribed: 30
Plan Total Prescribed Dose: 60 Gy
Reference Point Dosage Given to Date: 20 Gy
Reference Point Session Dosage Given: 2 Gy
Session Number: 10

## 2022-09-03 ENCOUNTER — Other Ambulatory Visit: Payer: Self-pay

## 2022-09-03 ENCOUNTER — Ambulatory Visit
Admission: RE | Admit: 2022-09-03 | Discharge: 2022-09-03 | Disposition: A | Discharge status: Home / Self Care | Payer: Medicare Other | Source: Ambulatory Visit | Attending: Radiation Oncology | Admitting: Radiation Oncology

## 2022-09-03 DIAGNOSIS — C07 Malignant neoplasm of parotid gland: Secondary | ICD-10-CM | POA: Diagnosis not present

## 2022-09-03 LAB — RAD ONC ARIA SESSION SUMMARY
Course Elapsed Days: 14
Plan Fractions Treated to Date: 11
Plan Prescribed Dose Per Fraction: 2 Gy
Plan Total Fractions Prescribed: 30
Plan Total Prescribed Dose: 60 Gy
Reference Point Dosage Given to Date: 22 Gy
Reference Point Session Dosage Given: 2 Gy
Session Number: 11

## 2022-09-04 ENCOUNTER — Other Ambulatory Visit: Payer: Self-pay

## 2022-09-04 ENCOUNTER — Ambulatory Visit
Admission: RE | Admit: 2022-09-04 | Discharge: 2022-09-04 | Disposition: A | Discharge status: Home / Self Care | Payer: Medicare Other | Source: Ambulatory Visit | Attending: Radiation Oncology | Admitting: Radiation Oncology

## 2022-09-04 DIAGNOSIS — C07 Malignant neoplasm of parotid gland: Secondary | ICD-10-CM | POA: Diagnosis not present

## 2022-09-04 LAB — RAD ONC ARIA SESSION SUMMARY
Course Elapsed Days: 15
Plan Fractions Treated to Date: 12
Plan Prescribed Dose Per Fraction: 2 Gy
Plan Total Fractions Prescribed: 30
Plan Total Prescribed Dose: 60 Gy
Reference Point Dosage Given to Date: 24 Gy
Reference Point Session Dosage Given: 2 Gy
Session Number: 12

## 2022-09-05 ENCOUNTER — Ambulatory Visit
Admission: RE | Admit: 2022-09-05 | Discharge: 2022-09-05 | Disposition: A | Discharge status: Home / Self Care | Payer: Medicare Other | Source: Ambulatory Visit | Attending: Radiation Oncology | Admitting: Radiation Oncology

## 2022-09-05 ENCOUNTER — Other Ambulatory Visit: Payer: Self-pay

## 2022-09-05 DIAGNOSIS — C07 Malignant neoplasm of parotid gland: Secondary | ICD-10-CM | POA: Diagnosis not present

## 2022-09-05 LAB — RAD ONC ARIA SESSION SUMMARY
Course Elapsed Days: 16
Plan Fractions Treated to Date: 13
Plan Prescribed Dose Per Fraction: 2 Gy
Plan Total Fractions Prescribed: 30
Plan Total Prescribed Dose: 60 Gy
Reference Point Dosage Given to Date: 26 Gy
Reference Point Session Dosage Given: 2 Gy
Session Number: 13

## 2022-09-06 ENCOUNTER — Inpatient Hospital Stay: Payer: Medicare Other

## 2022-09-06 ENCOUNTER — Ambulatory Visit
Admission: RE | Admit: 2022-09-06 | Discharge: 2022-09-06 | Disposition: A | Discharge status: Home / Self Care | Payer: Medicare Other | Source: Ambulatory Visit | Attending: Radiation Oncology | Admitting: Radiation Oncology

## 2022-09-06 ENCOUNTER — Other Ambulatory Visit: Payer: Self-pay

## 2022-09-06 DIAGNOSIS — C07 Malignant neoplasm of parotid gland: Secondary | ICD-10-CM

## 2022-09-06 LAB — RAD ONC ARIA SESSION SUMMARY
Course Elapsed Days: 17
Plan Fractions Treated to Date: 14
Plan Prescribed Dose Per Fraction: 2 Gy
Plan Total Fractions Prescribed: 30
Plan Total Prescribed Dose: 60 Gy
Reference Point Dosage Given to Date: 28 Gy
Reference Point Session Dosage Given: 2 Gy
Session Number: 14

## 2022-09-06 LAB — CBC
HCT: 30.1 % — ABNORMAL LOW (ref 36.0–46.0)
Hemoglobin: 9.9 g/dL — ABNORMAL LOW (ref 12.0–15.0)
MCH: 33 pg (ref 26.0–34.0)
MCHC: 32.9 g/dL (ref 30.0–36.0)
MCV: 100.3 fL — ABNORMAL HIGH (ref 80.0–100.0)
Platelets: 154 10*3/uL (ref 150–400)
RBC: 3 MIL/uL — ABNORMAL LOW (ref 3.87–5.11)
RDW: 12.6 % (ref 11.5–15.5)
WBC: 5.6 10*3/uL (ref 4.0–10.5)
nRBC: 0 % (ref 0.0–0.2)

## 2022-09-07 ENCOUNTER — Other Ambulatory Visit: Payer: Self-pay

## 2022-09-07 ENCOUNTER — Ambulatory Visit
Admission: RE | Admit: 2022-09-07 | Discharge: 2022-09-07 | Disposition: A | Discharge status: Home / Self Care | Payer: Medicare Other | Source: Ambulatory Visit | Attending: Radiation Oncology | Admitting: Radiation Oncology

## 2022-09-07 DIAGNOSIS — C07 Malignant neoplasm of parotid gland: Secondary | ICD-10-CM | POA: Diagnosis not present

## 2022-09-07 LAB — RAD ONC ARIA SESSION SUMMARY
Course Elapsed Days: 18
Plan Fractions Treated to Date: 15
Plan Prescribed Dose Per Fraction: 2 Gy
Plan Total Fractions Prescribed: 30
Plan Total Prescribed Dose: 60 Gy
Reference Point Dosage Given to Date: 30 Gy
Reference Point Session Dosage Given: 2 Gy
Session Number: 15

## 2022-09-10 ENCOUNTER — Other Ambulatory Visit: Payer: Self-pay

## 2022-09-10 ENCOUNTER — Ambulatory Visit
Admission: RE | Admit: 2022-09-10 | Discharge: 2022-09-10 | Disposition: A | Discharge status: Home / Self Care | Payer: Medicare Other | Source: Ambulatory Visit | Attending: Radiation Oncology | Admitting: Radiation Oncology

## 2022-09-10 DIAGNOSIS — C07 Malignant neoplasm of parotid gland: Secondary | ICD-10-CM | POA: Diagnosis not present

## 2022-09-10 LAB — RAD ONC ARIA SESSION SUMMARY
Course Elapsed Days: 21
Plan Fractions Treated to Date: 16
Plan Prescribed Dose Per Fraction: 2 Gy
Plan Total Fractions Prescribed: 30
Plan Total Prescribed Dose: 60 Gy
Reference Point Dosage Given to Date: 32 Gy
Reference Point Session Dosage Given: 2 Gy
Session Number: 16

## 2022-09-11 ENCOUNTER — Ambulatory Visit: Payer: Medicare Other

## 2022-09-12 ENCOUNTER — Ambulatory Visit: Payer: Medicare Other

## 2022-09-12 ENCOUNTER — Inpatient Hospital Stay: Payer: Medicare Other

## 2022-09-17 ENCOUNTER — Ambulatory Visit
Admission: RE | Admit: 2022-09-17 | Discharge: 2022-09-17 | Disposition: A | Discharge status: Home / Self Care | Payer: Medicare Other | Source: Ambulatory Visit | Attending: Radiation Oncology | Admitting: Radiation Oncology

## 2022-09-17 ENCOUNTER — Other Ambulatory Visit: Payer: Self-pay

## 2022-09-17 DIAGNOSIS — C07 Malignant neoplasm of parotid gland: Secondary | ICD-10-CM | POA: Diagnosis not present

## 2022-09-17 LAB — RAD ONC ARIA SESSION SUMMARY
Course Elapsed Days: 28
Plan Fractions Treated to Date: 17
Plan Prescribed Dose Per Fraction: 2 Gy
Plan Total Fractions Prescribed: 30
Plan Total Prescribed Dose: 60 Gy
Reference Point Dosage Given to Date: 34 Gy
Reference Point Session Dosage Given: 2 Gy
Session Number: 17

## 2022-09-18 ENCOUNTER — Other Ambulatory Visit: Payer: Self-pay

## 2022-09-18 ENCOUNTER — Ambulatory Visit
Admission: RE | Admit: 2022-09-18 | Discharge: 2022-09-18 | Disposition: A | Discharge status: Home / Self Care | Payer: Medicare Other | Source: Ambulatory Visit | Attending: Radiation Oncology | Admitting: Radiation Oncology

## 2022-09-18 DIAGNOSIS — C07 Malignant neoplasm of parotid gland: Secondary | ICD-10-CM | POA: Diagnosis not present

## 2022-09-18 LAB — RAD ONC ARIA SESSION SUMMARY
Course Elapsed Days: 29
Plan Fractions Treated to Date: 18
Plan Prescribed Dose Per Fraction: 2 Gy
Plan Total Fractions Prescribed: 30
Plan Total Prescribed Dose: 60 Gy
Reference Point Dosage Given to Date: 36 Gy
Reference Point Session Dosage Given: 2 Gy
Session Number: 18

## 2022-09-19 ENCOUNTER — Other Ambulatory Visit: Payer: Self-pay

## 2022-09-19 ENCOUNTER — Ambulatory Visit
Admission: RE | Admit: 2022-09-19 | Discharge: 2022-09-19 | Disposition: A | Discharge status: Home / Self Care | Payer: Medicare Other | Source: Ambulatory Visit | Attending: Radiation Oncology | Admitting: Radiation Oncology

## 2022-09-19 ENCOUNTER — Inpatient Hospital Stay: Payer: Medicare Other

## 2022-09-19 DIAGNOSIS — C07 Malignant neoplasm of parotid gland: Secondary | ICD-10-CM | POA: Diagnosis not present

## 2022-09-19 LAB — CBC
HCT: 32.5 % — ABNORMAL LOW (ref 36.0–46.0)
Hemoglobin: 10.7 g/dL — ABNORMAL LOW (ref 12.0–15.0)
MCH: 32.9 pg (ref 26.0–34.0)
MCHC: 32.9 g/dL (ref 30.0–36.0)
MCV: 100 fL (ref 80.0–100.0)
Platelets: 167 10*3/uL (ref 150–400)
RBC: 3.25 MIL/uL — ABNORMAL LOW (ref 3.87–5.11)
RDW: 12.6 % (ref 11.5–15.5)
WBC: 6.5 10*3/uL (ref 4.0–10.5)
nRBC: 0 % (ref 0.0–0.2)

## 2022-09-19 LAB — RAD ONC ARIA SESSION SUMMARY
Course Elapsed Days: 30
Plan Fractions Treated to Date: 19
Plan Prescribed Dose Per Fraction: 2 Gy
Plan Total Fractions Prescribed: 30
Plan Total Prescribed Dose: 60 Gy
Reference Point Dosage Given to Date: 38 Gy
Reference Point Session Dosage Given: 2 Gy
Session Number: 19

## 2022-09-20 ENCOUNTER — Ambulatory Visit
Admission: RE | Admit: 2022-09-20 | Discharge: 2022-09-20 | Disposition: A | Discharge status: Home / Self Care | Payer: Medicare Other | Source: Ambulatory Visit | Attending: Radiation Oncology | Admitting: Radiation Oncology

## 2022-09-20 ENCOUNTER — Other Ambulatory Visit: Payer: Self-pay

## 2022-09-20 DIAGNOSIS — C07 Malignant neoplasm of parotid gland: Secondary | ICD-10-CM | POA: Diagnosis not present

## 2022-09-20 LAB — RAD ONC ARIA SESSION SUMMARY
Course Elapsed Days: 31
Plan Fractions Treated to Date: 20
Plan Prescribed Dose Per Fraction: 2 Gy
Plan Total Fractions Prescribed: 30
Plan Total Prescribed Dose: 60 Gy
Reference Point Dosage Given to Date: 40 Gy
Reference Point Session Dosage Given: 2 Gy
Session Number: 20

## 2022-09-21 ENCOUNTER — Other Ambulatory Visit: Payer: Self-pay

## 2022-09-21 ENCOUNTER — Ambulatory Visit
Admission: RE | Admit: 2022-09-21 | Discharge: 2022-09-21 | Disposition: A | Discharge status: Home / Self Care | Payer: Medicare Other | Source: Ambulatory Visit | Attending: Radiation Oncology | Admitting: Radiation Oncology

## 2022-09-21 DIAGNOSIS — Z791 Long term (current) use of non-steroidal anti-inflammatories (NSAID): Secondary | ICD-10-CM | POA: Diagnosis not present

## 2022-09-21 DIAGNOSIS — K219 Gastro-esophageal reflux disease without esophagitis: Secondary | ICD-10-CM | POA: Diagnosis not present

## 2022-09-21 DIAGNOSIS — Z7902 Long term (current) use of antithrombotics/antiplatelets: Secondary | ICD-10-CM | POA: Insufficient documentation

## 2022-09-21 DIAGNOSIS — Z85828 Personal history of other malignant neoplasm of skin: Secondary | ICD-10-CM | POA: Insufficient documentation

## 2022-09-21 DIAGNOSIS — I10 Essential (primary) hypertension: Secondary | ICD-10-CM | POA: Diagnosis not present

## 2022-09-21 DIAGNOSIS — Z79899 Other long term (current) drug therapy: Secondary | ICD-10-CM | POA: Insufficient documentation

## 2022-09-21 DIAGNOSIS — C07 Malignant neoplasm of parotid gland: Secondary | ICD-10-CM | POA: Insufficient documentation

## 2022-09-21 LAB — RAD ONC ARIA SESSION SUMMARY
Course Elapsed Days: 32
Plan Fractions Treated to Date: 21
Plan Prescribed Dose Per Fraction: 2 Gy
Plan Total Fractions Prescribed: 30
Plan Total Prescribed Dose: 60 Gy
Reference Point Dosage Given to Date: 42 Gy
Reference Point Session Dosage Given: 2 Gy
Session Number: 21

## 2022-09-24 ENCOUNTER — Other Ambulatory Visit: Payer: Self-pay

## 2022-09-24 ENCOUNTER — Ambulatory Visit
Admission: RE | Admit: 2022-09-24 | Discharge: 2022-09-24 | Disposition: A | Discharge status: Home / Self Care | Payer: Medicare Other | Source: Ambulatory Visit | Attending: Radiation Oncology | Admitting: Radiation Oncology

## 2022-09-24 DIAGNOSIS — C07 Malignant neoplasm of parotid gland: Secondary | ICD-10-CM | POA: Diagnosis not present

## 2022-09-24 LAB — RAD ONC ARIA SESSION SUMMARY
Course Elapsed Days: 35
Plan Fractions Treated to Date: 22
Plan Prescribed Dose Per Fraction: 2 Gy
Plan Total Fractions Prescribed: 30
Plan Total Prescribed Dose: 60 Gy
Reference Point Dosage Given to Date: 44 Gy
Reference Point Session Dosage Given: 2 Gy
Session Number: 22

## 2022-09-25 ENCOUNTER — Ambulatory Visit
Admission: RE | Admit: 2022-09-25 | Discharge: 2022-09-25 | Disposition: A | Discharge status: Home / Self Care | Payer: Medicare Other | Source: Ambulatory Visit | Attending: Radiation Oncology | Admitting: Radiation Oncology

## 2022-09-25 ENCOUNTER — Other Ambulatory Visit: Payer: Self-pay

## 2022-09-25 DIAGNOSIS — C07 Malignant neoplasm of parotid gland: Secondary | ICD-10-CM | POA: Diagnosis not present

## 2022-09-25 LAB — RAD ONC ARIA SESSION SUMMARY
Course Elapsed Days: 36
Plan Fractions Treated to Date: 23
Plan Prescribed Dose Per Fraction: 2 Gy
Plan Total Fractions Prescribed: 30
Plan Total Prescribed Dose: 60 Gy
Reference Point Dosage Given to Date: 46 Gy
Reference Point Session Dosage Given: 2 Gy
Session Number: 23

## 2022-09-26 ENCOUNTER — Inpatient Hospital Stay: Payer: Medicare Other

## 2022-09-26 ENCOUNTER — Ambulatory Visit
Admission: RE | Admit: 2022-09-26 | Discharge: 2022-09-26 | Disposition: A | Discharge status: Home / Self Care | Payer: Medicare Other | Source: Ambulatory Visit | Attending: Radiation Oncology | Admitting: Radiation Oncology

## 2022-09-26 ENCOUNTER — Other Ambulatory Visit: Payer: Self-pay

## 2022-09-26 DIAGNOSIS — C7800 Secondary malignant neoplasm of unspecified lung: Secondary | ICD-10-CM | POA: Insufficient documentation

## 2022-09-26 DIAGNOSIS — C07 Malignant neoplasm of parotid gland: Secondary | ICD-10-CM

## 2022-09-26 DIAGNOSIS — Z79899 Other long term (current) drug therapy: Secondary | ICD-10-CM | POA: Insufficient documentation

## 2022-09-26 LAB — RAD ONC ARIA SESSION SUMMARY
Course Elapsed Days: 37
Plan Fractions Treated to Date: 24
Plan Prescribed Dose Per Fraction: 2 Gy
Plan Total Fractions Prescribed: 30
Plan Total Prescribed Dose: 60 Gy
Reference Point Dosage Given to Date: 48 Gy
Reference Point Session Dosage Given: 2 Gy
Session Number: 24

## 2022-09-26 LAB — CBC
HCT: 31.5 % — ABNORMAL LOW (ref 36.0–46.0)
Hemoglobin: 10.6 g/dL — ABNORMAL LOW (ref 12.0–15.0)
MCH: 33.5 pg (ref 26.0–34.0)
MCHC: 33.7 g/dL (ref 30.0–36.0)
MCV: 99.7 fL (ref 80.0–100.0)
Platelets: 148 10*3/uL — ABNORMAL LOW (ref 150–400)
RBC: 3.16 MIL/uL — ABNORMAL LOW (ref 3.87–5.11)
RDW: 12.6 % (ref 11.5–15.5)
WBC: 6.7 10*3/uL (ref 4.0–10.5)
nRBC: 0 % (ref 0.0–0.2)

## 2022-09-27 ENCOUNTER — Ambulatory Visit
Admission: RE | Admit: 2022-09-27 | Discharge: 2022-09-27 | Disposition: A | Discharge status: Home / Self Care | Payer: Medicare Other | Source: Ambulatory Visit | Attending: Radiation Oncology | Admitting: Radiation Oncology

## 2022-09-27 ENCOUNTER — Other Ambulatory Visit: Payer: Self-pay

## 2022-09-27 DIAGNOSIS — C07 Malignant neoplasm of parotid gland: Secondary | ICD-10-CM | POA: Diagnosis not present

## 2022-09-27 LAB — RAD ONC ARIA SESSION SUMMARY
Course Elapsed Days: 38
Plan Fractions Treated to Date: 25
Plan Prescribed Dose Per Fraction: 2 Gy
Plan Total Fractions Prescribed: 30
Plan Total Prescribed Dose: 60 Gy
Reference Point Dosage Given to Date: 50 Gy
Reference Point Session Dosage Given: 2 Gy
Session Number: 25

## 2022-09-28 ENCOUNTER — Other Ambulatory Visit: Payer: Self-pay

## 2022-09-28 ENCOUNTER — Ambulatory Visit
Admission: RE | Admit: 2022-09-28 | Discharge: 2022-09-28 | Disposition: A | Discharge status: Home / Self Care | Payer: Medicare Other | Source: Ambulatory Visit | Attending: Radiation Oncology | Admitting: Radiation Oncology

## 2022-09-28 DIAGNOSIS — C07 Malignant neoplasm of parotid gland: Secondary | ICD-10-CM | POA: Diagnosis not present

## 2022-09-28 LAB — RAD ONC ARIA SESSION SUMMARY
Course Elapsed Days: 39
Plan Fractions Treated to Date: 26
Plan Prescribed Dose Per Fraction: 2 Gy
Plan Total Fractions Prescribed: 30
Plan Total Prescribed Dose: 60 Gy
Reference Point Dosage Given to Date: 52 Gy
Reference Point Session Dosage Given: 2 Gy
Session Number: 26

## 2022-10-01 ENCOUNTER — Other Ambulatory Visit: Payer: Self-pay

## 2022-10-01 ENCOUNTER — Ambulatory Visit
Admission: RE | Admit: 2022-10-01 | Discharge: 2022-10-01 | Disposition: A | Discharge status: Home / Self Care | Payer: Medicare Other | Source: Ambulatory Visit | Attending: Radiation Oncology | Admitting: Radiation Oncology

## 2022-10-01 DIAGNOSIS — C07 Malignant neoplasm of parotid gland: Secondary | ICD-10-CM | POA: Diagnosis not present

## 2022-10-01 LAB — RAD ONC ARIA SESSION SUMMARY
Course Elapsed Days: 42
Plan Fractions Treated to Date: 27
Plan Prescribed Dose Per Fraction: 2 Gy
Plan Total Fractions Prescribed: 30
Plan Total Prescribed Dose: 60 Gy
Reference Point Dosage Given to Date: 54 Gy
Reference Point Session Dosage Given: 2 Gy
Session Number: 27

## 2022-10-02 ENCOUNTER — Other Ambulatory Visit: Payer: Self-pay

## 2022-10-02 ENCOUNTER — Ambulatory Visit: Payer: Medicare Other

## 2022-10-02 ENCOUNTER — Ambulatory Visit
Admission: RE | Admit: 2022-10-02 | Discharge: 2022-10-02 | Disposition: A | Discharge status: Home / Self Care | Payer: Medicare Other | Source: Ambulatory Visit | Attending: Radiation Oncology | Admitting: Radiation Oncology

## 2022-10-02 DIAGNOSIS — C07 Malignant neoplasm of parotid gland: Secondary | ICD-10-CM | POA: Diagnosis not present

## 2022-10-02 LAB — RAD ONC ARIA SESSION SUMMARY
Course Elapsed Days: 43
Plan Fractions Treated to Date: 28
Plan Prescribed Dose Per Fraction: 2 Gy
Plan Total Fractions Prescribed: 30
Plan Total Prescribed Dose: 60 Gy
Reference Point Dosage Given to Date: 56 Gy
Reference Point Session Dosage Given: 2 Gy
Session Number: 28

## 2022-10-03 ENCOUNTER — Ambulatory Visit
Admission: RE | Admit: 2022-10-03 | Discharge: 2022-10-03 | Disposition: A | Discharge status: Home / Self Care | Payer: Medicare Other | Source: Ambulatory Visit | Attending: Radiation Oncology | Admitting: Radiation Oncology

## 2022-10-03 ENCOUNTER — Other Ambulatory Visit: Payer: Self-pay

## 2022-10-03 ENCOUNTER — Ambulatory Visit: Payer: Medicare Other

## 2022-10-03 DIAGNOSIS — C07 Malignant neoplasm of parotid gland: Secondary | ICD-10-CM | POA: Diagnosis not present

## 2022-10-03 LAB — RAD ONC ARIA SESSION SUMMARY
Course Elapsed Days: 44
Plan Fractions Treated to Date: 29
Plan Prescribed Dose Per Fraction: 2 Gy
Plan Total Fractions Prescribed: 30
Plan Total Prescribed Dose: 60 Gy
Reference Point Dosage Given to Date: 58 Gy
Reference Point Session Dosage Given: 2 Gy
Session Number: 29

## 2022-10-04 ENCOUNTER — Ambulatory Visit
Admission: RE | Admit: 2022-10-04 | Discharge: 2022-10-04 | Disposition: A | Discharge status: Home / Self Care | Payer: Medicare Other | Source: Ambulatory Visit | Attending: Radiation Oncology | Admitting: Radiation Oncology

## 2022-10-04 ENCOUNTER — Other Ambulatory Visit: Payer: Self-pay

## 2022-10-04 DIAGNOSIS — C07 Malignant neoplasm of parotid gland: Secondary | ICD-10-CM | POA: Diagnosis not present

## 2022-10-04 LAB — RAD ONC ARIA SESSION SUMMARY
Course Elapsed Days: 45
Plan Fractions Treated to Date: 30
Plan Prescribed Dose Per Fraction: 2 Gy
Plan Total Fractions Prescribed: 30
Plan Total Prescribed Dose: 60 Gy
Reference Point Dosage Given to Date: 60 Gy
Reference Point Session Dosage Given: 2 Gy
Session Number: 30

## 2022-11-01 ENCOUNTER — Ambulatory Visit
Admission: RE | Admit: 2022-11-01 | Discharge: 2022-11-01 | Disposition: A | Discharge status: Home / Self Care | Payer: Medicare Other | Source: Ambulatory Visit | Attending: Radiation Oncology | Admitting: Radiation Oncology

## 2022-11-01 VITALS — BP 142/75 | HR 85 | Temp 95.3°F | Resp 18 | Ht 66.0 in

## 2022-11-01 DIAGNOSIS — C07 Malignant neoplasm of parotid gland: Secondary | ICD-10-CM

## 2022-11-01 NOTE — Progress Notes (Signed)
Radiation Oncology Follow up Note  Name: Maureen Mitchell   Date:   11/01/2022 MRN:  [id number] DOB: May 17, 1927    This 89 y.o. 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  Mass has completely resolved..  COMPLICATIONS OF TREATMENT: none  FOLLOW UP COMPLIANCE: keeps appointments   PHYSICAL EXAM:  BP (!) 142/75   Pulse 85   Temp (!) 95.3 F (35.2 C)   Resp 18   Ht '5\' 6"'$  (1.676 m)   BMI 31.25 kg/m  Area the right parotid has no evidence of mass or nodularity neck is clear without evidence of cervical or supraclavicular adenopathy.  Oral cavity is clear.  Well-developed well-nourished patient in NAD. HEENT reveals PERLA, EOMI, discs not visualized.  Oral cavity is clear. No oral mucosal lesions are identified. Neck is clear without evidence of cervical or supraclavicular adenopathy. Lungs are clear to A&P. Cardiac examination is essentially unremarkable with regular rate and rhythm without murmur rub or thrill. Abdomen is benign with no organomegaly or masses noted. Motor sensory and DTR levels are equal and symmetric in the upper and lower extremities. Cranial nerves II through XII are grossly intact. Proprioception is intact. No peripheral adenopathy or edema is identified. No motor or sensory levels are noted. Crude visual fields are within normal range.  RADIOLOGY RESULTS: No current films for review  PLAN: Present time patient has done well with no evidence of disease after radiation therapy.  She is moving to  [place] of asked her to set up care with ENT in that region.  We will transfer files if that is necessary.  Patient is to call with any concerns at any time.  I would like to take this opportunity to thank you for allowing me to participate in the care of your patient.Noreene Filbert, MD

## 2022-11-05 ENCOUNTER — Ambulatory Visit: Payer: PRIVATE HEALTH INSURANCE | Admitting: Radiation Oncology

## 2022-11-19 ENCOUNTER — Ambulatory Visit: Payer: PRIVATE HEALTH INSURANCE | Admitting: Radiation Oncology

## 2023-03-04 IMAGING — MR MR MRA HEAD W/O CM
2 series · 19 of 48 positions shown · non-contrast
Comparison: MRI of the brain without contrast 12/11/2021.

CLINICAL DATA: Right frontal lobe infarcts. Abnormal MRI.
Hypertension. Intermittent dizziness.

EXAM:
MRA HEAD WITHOUT CONTRAST
TECHNIQUE: Angiographic images of the Circle of Willis were acquired using MRA
technique without intravenous contrast.

[Series 3: (id) mt fs · axial · 1.4mm · 0.43mm/px · z∈[-32,+57]mm · 18 of 136 slices shown]
[im 1/136]
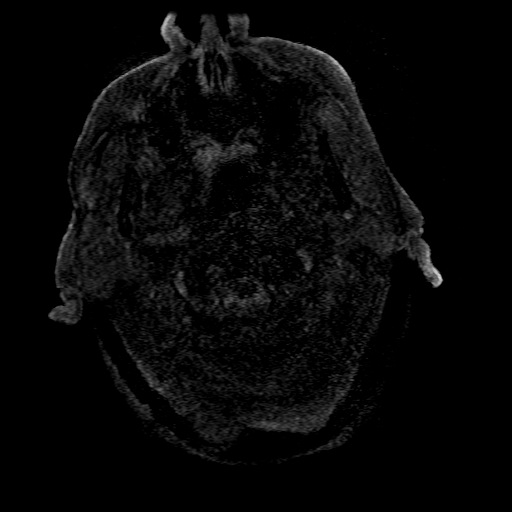
[im 3/136]
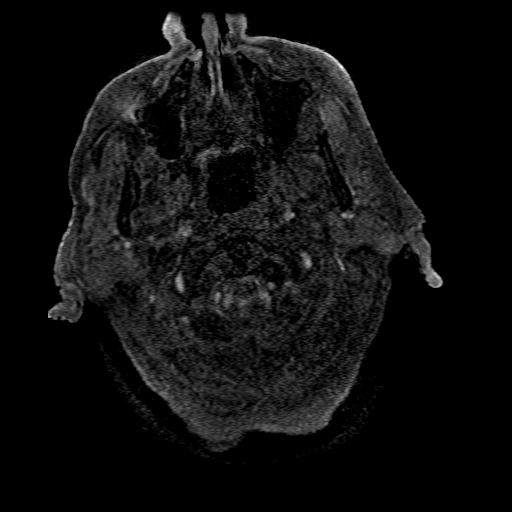
[im 6/136]
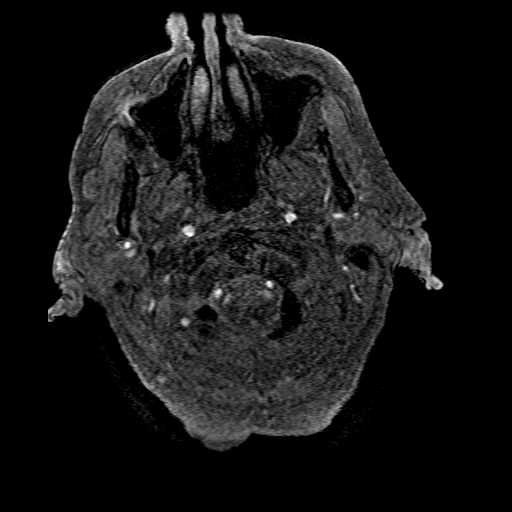
[im 9/136]
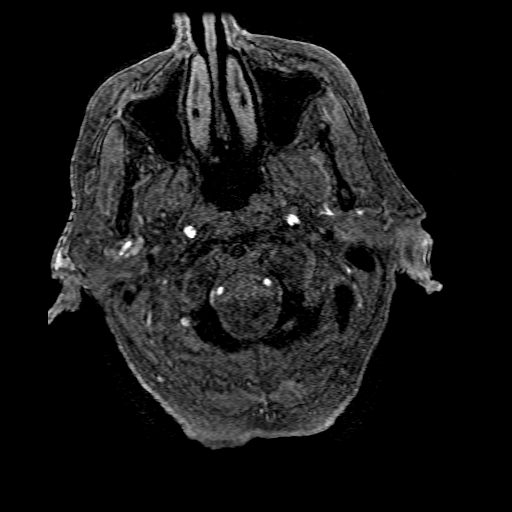
[im 12/136]
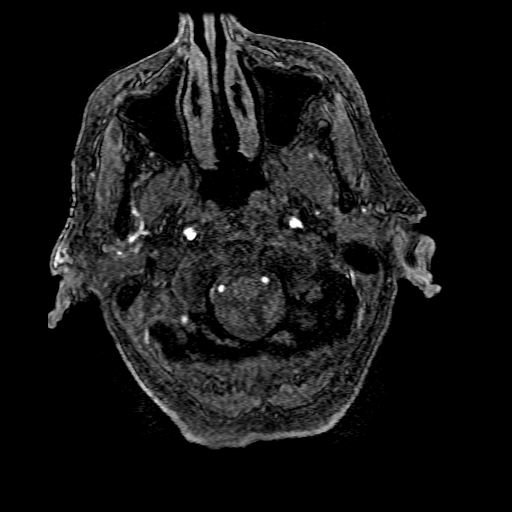
[im 15/136]
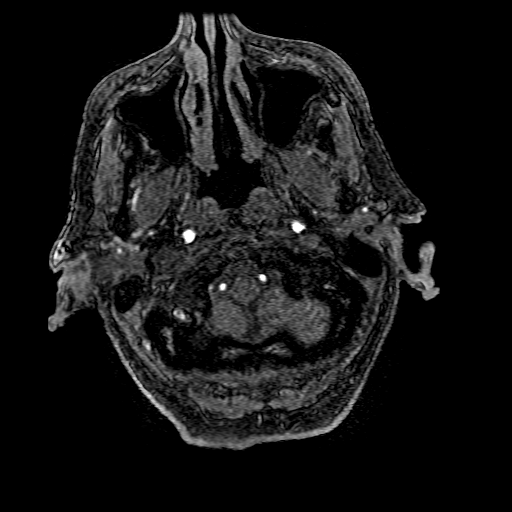
[im 18/136]
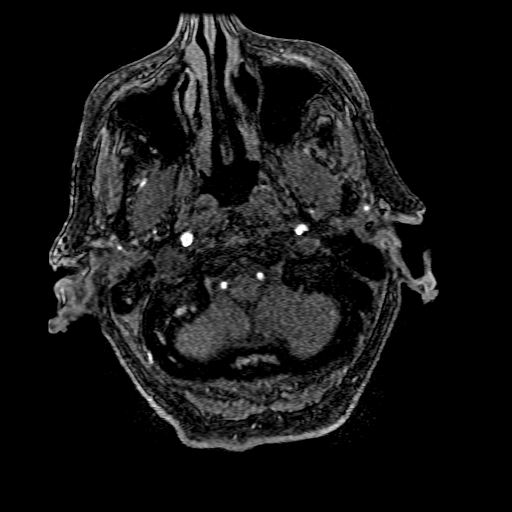
[im 21/136]
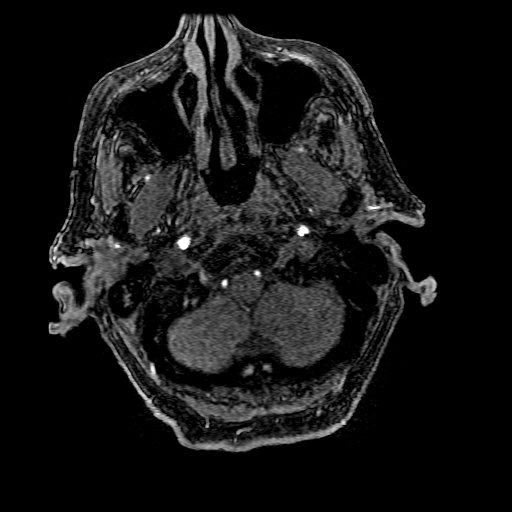
[im 24/136]
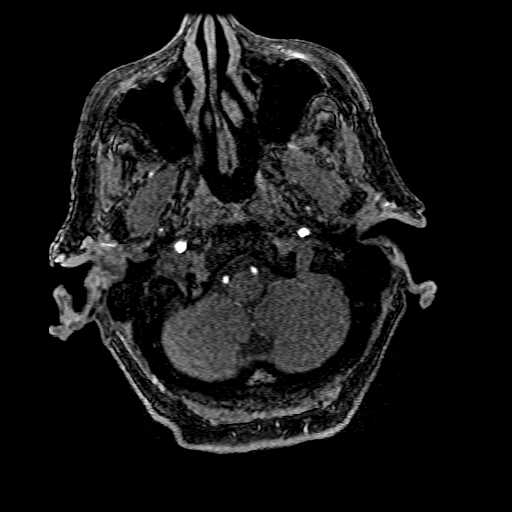
[im 27/136]
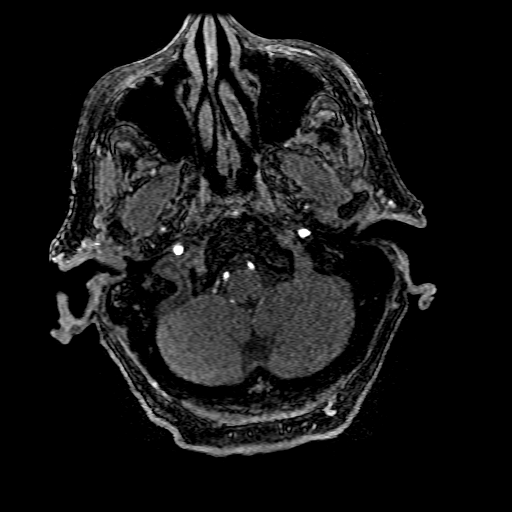
[im 42/136]
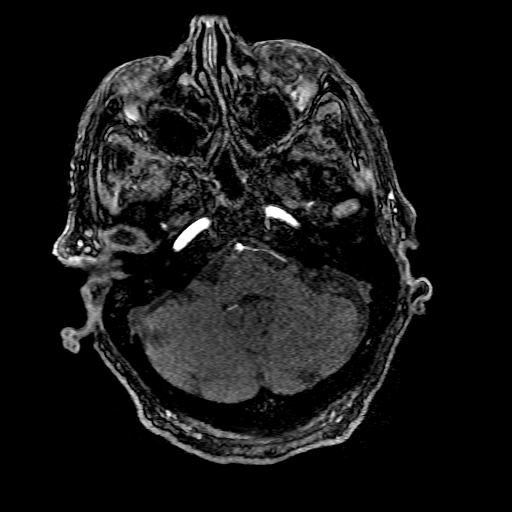
[im 59/136]
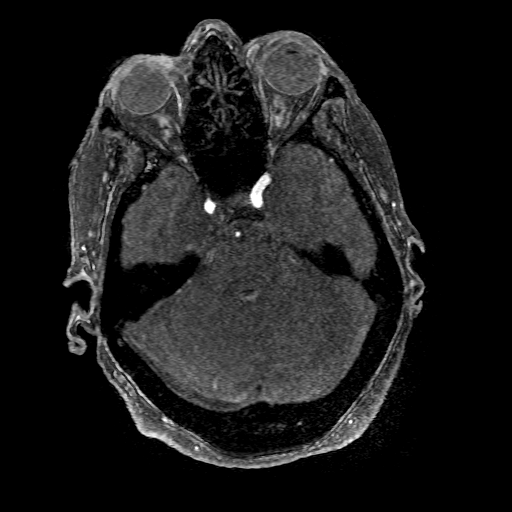
[im 68/136]
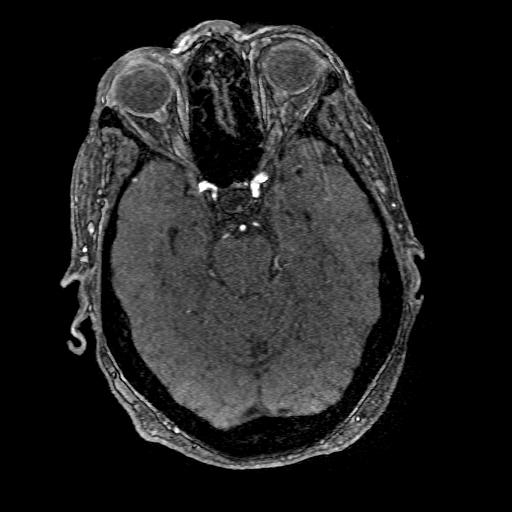
[im 77/136]
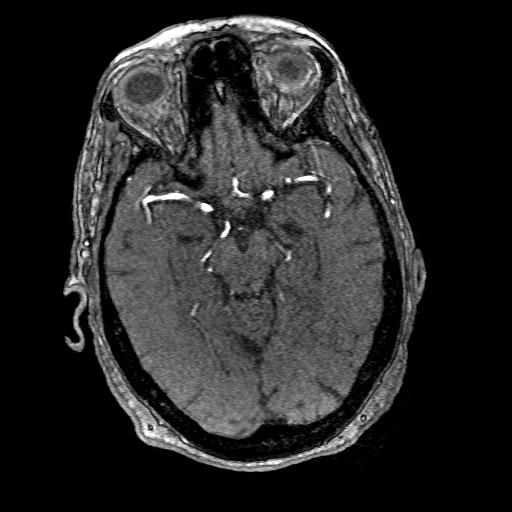
[im 94/136]
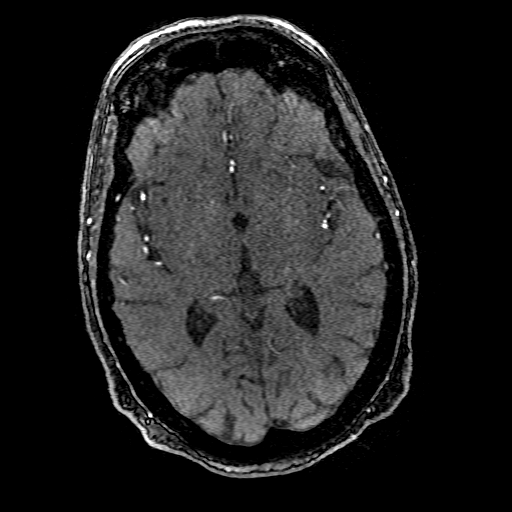
[im 112/136]
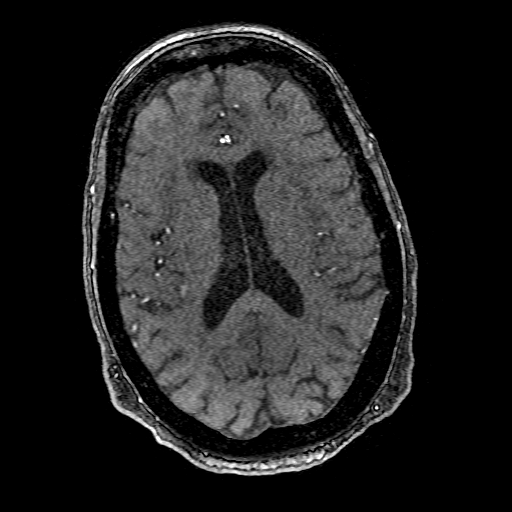
[im 115/136]
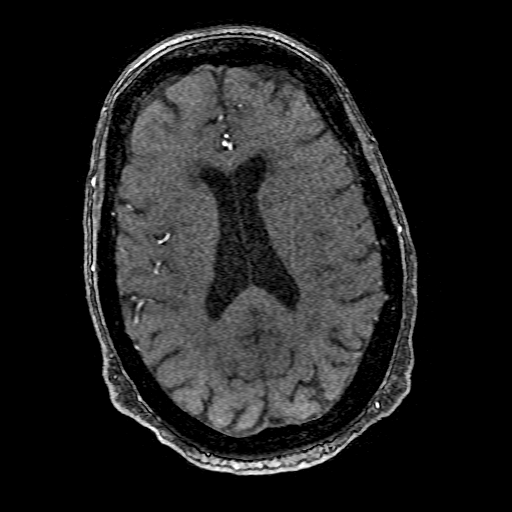
[im 130/136]
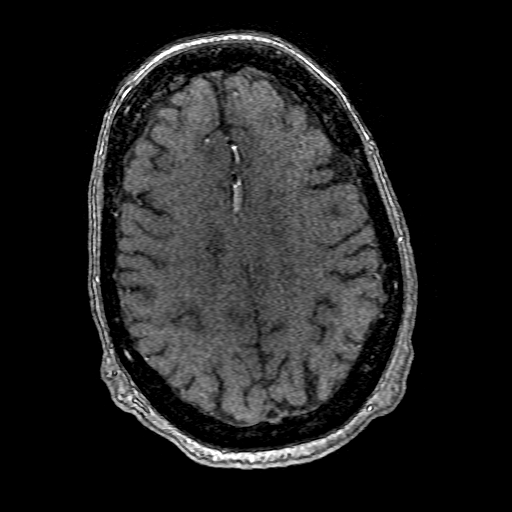

[Series 303: processed images · axial · 1.4mm · 0.43mm/px · 1 of 1 slices shown]
[im 1/1]
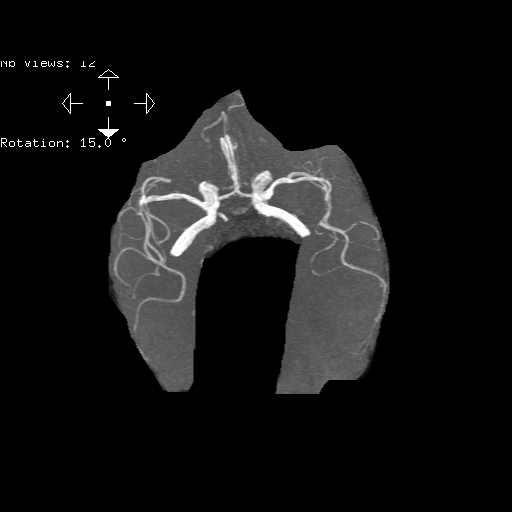

[19 of 48 positions shown; findings below may reference images not displayed]

FINDINGS: Anterior circulation: Mild atherosclerotic irregularity is present
within the cavernous internal carotid arteries bilaterally without
significant stenosis through the ICA termini. The A1 and M1 segments
are normal. Mild irregularity is present in the A2 segments
bilaterally with moderate distal left A2 segment stenosis. No
significant proximal stenosis or occlusion is present on the right.
M1 segments are within normal limits. MCA bifurcations are
unremarkable. Moderate stenosis is present in the inferior right M2
branch. Distal segmental narrowing is present bilaterally.

Posterior circulation: The vertebral arteries are codominant. Right
PICA origin is visualized and within normal limits. The left AICA is
dominant. Vertebrobasilar junction is normal. The basilar artery is
within normal limits. Both posterior cerebral arteries are of fetal
type. Moderate narrowing is present in the proximal right and more
distal left P2 segments. There is significant attenuation of distal
PCA branches on the left.

Anatomic variants: Fetal type posterior cerebral arteries
bilaterally.

Other: None.
IMPRESSION: 1. No significant stenosis or occlusion in the anterior in superior
right MCA branches.
2. Moderate distal left A2 segment stenosis.
3. Moderate stenosis of the inferior right M2 branch.
4. Moderate narrowing of the proximal right and more distal left P2
segments.
5. Moderate attenuation of distal left PCA branches.
